# Patient Record
Sex: Female | Born: 1985
Health system: Southern US, Community
[De-identification: ages and names within clinical notes are randomized; demographics above are authoritative.]

## PROBLEM LIST (undated history)

## (undated) DIAGNOSIS — F419 Anxiety disorder, unspecified: Secondary | ICD-10-CM

## (undated) DIAGNOSIS — O009 Unspecified ectopic pregnancy without intrauterine pregnancy: Secondary | ICD-10-CM

## (undated) HISTORY — PX: UPPER GI ENDOSCOPY: SHX6162

## (undated) HISTORY — PX: KNEE SURGERY: SHX244

## (undated) HISTORY — DX: Unspecified ectopic pregnancy without intrauterine pregnancy: O00.90

## (undated) HISTORY — DX: Anxiety disorder, unspecified: F41.9

## (undated) HISTORY — PX: LYMPH NODE BIOPSY: SHX201

---

## 2013-04-07 ENCOUNTER — Ambulatory Visit (INDEPENDENT_AMBULATORY_CARE_PROVIDER_SITE_OTHER): Payer: BC Managed Care – PPO | Admitting: Family Medicine

## 2013-04-07 ENCOUNTER — Encounter: Payer: Self-pay | Admitting: Family Medicine

## 2013-04-07 VITALS — BP 104/60 | HR 99 | Temp 98.3°F | Ht 71.0 in | Wt 163.5 lb

## 2013-04-07 DIAGNOSIS — J020 Streptococcal pharyngitis: Secondary | ICD-10-CM | POA: Insufficient documentation

## 2013-04-07 LAB — POCT RAPID STREP A (OFFICE): Rapid Strep A Screen: NEGATIVE

## 2013-04-07 MED ORDER — AMOXICILLIN 875 MG PO TABS: 875.0000 mg | ORAL_TABLET | Freq: Two times a day (BID) | ORAL | Status: AC

## 2013-04-07 NOTE — Progress Notes (Signed)
H/o tonsillitis in the past.  Started with diffuse aches.  Then had a fever.  Didn't sleep well.  Then has a ST in the meantime. Pain swallowing. No cough.  B ear pain.  No rhinorrhea.  No rash.  Known sick contacts.  She can still swallow.  Voice is lower pitch than typical.  She can take a deep breath. Speaking in complete sentences.   Had seen [facility] student health prev.   Meds, vitals, and allergies reviewed.   ROS: See HPI.  Otherwise, noncontributory.  GEN: nad, alert and oriented HEENT: mucous membranes moist, tm w/o erythema, nasal exam w/o erythema, clear discharge noted,  OP with cobblestoning and B (R slightly > L) tonsillar enlargement with B erythema and exudates.   NECK: supple w/ B tender LA, no stridor CV: rrr.   PULM: ctab, no inc wob EXT: no edema SKIN: no acute rash

## 2013-04-07 NOTE — Assessment & Plan Note (Signed)
Presumed with fever, ST, tender LA, exudates and absence of cough.  Not likely to be mono, no sign of PTA.  Okay for outpatient f/u.  Routine cautions given.  F/u prn.  Start amoxil today.

## 2013-04-07 NOTE — Patient Instructions (Addendum)
Start the antibiotics today.  Drink plenty of fluids, take tylenol as needed, and gargle with warm salt water for your throat.  This should gradually improve.  Take care.  Let us know if you have other concerns.  

## 2014-05-11 ENCOUNTER — Emergency Department: Payer: Self-pay | Admitting: Emergency Medicine

## 2014-05-11 LAB — CBC
HCT: 39.3 % (ref 35.0–47.0)
HGB: 13.1 g/dL (ref 12.0–16.0)
MCH: 29.4 pg (ref 26.0–34.0)
MCHC: 33.4 g/dL (ref 32.0–36.0)
MCV: 88 fL (ref 80–100)
Platelet: 186 10*3/uL (ref 150–440)
RBC: 4.46 10*6/uL (ref 3.80–5.20)
RDW: 13.2 % (ref 11.5–14.5)
WBC: 10.8 10*3/uL (ref 3.6–11.0)

## 2014-05-11 LAB — HCG, QUANTITATIVE, PREGNANCY: Beta Hcg, Quant.: 144 m[IU]/mL — ABNORMAL HIGH

## 2014-05-13 ENCOUNTER — Other Ambulatory Visit: Payer: Self-pay | Admitting: Obstetrics and Gynecology

## 2014-05-13 LAB — HCG, QUANTITATIVE, PREGNANCY: Beta Hcg, Quant.: 112 m[IU]/mL — ABNORMAL HIGH

## 2014-05-15 ENCOUNTER — Ambulatory Visit: Payer: Self-pay | Admitting: Obstetrics and Gynecology

## 2014-05-15 LAB — HCG, QUANTITATIVE, PREGNANCY: Beta Hcg, Quant.: 20 m[IU]/mL — ABNORMAL HIGH

## 2014-05-15 LAB — COMPREHENSIVE METABOLIC PANEL
Albumin: 3.5 g/dL (ref 3.4–5.0)
Alkaline Phosphatase: 38 U/L — ABNORMAL LOW
Anion Gap: 3 — ABNORMAL LOW (ref 7–16)
BUN: 9 mg/dL (ref 7–18)
Bilirubin,Total: 0.4 mg/dL (ref 0.2–1.0)
Calcium, Total: 8.2 mg/dL — ABNORMAL LOW (ref 8.5–10.1)
Chloride: 109 mmol/L — ABNORMAL HIGH (ref 98–107)
Co2: 28 mmol/L (ref 21–32)
Creatinine: 0.73 mg/dL (ref 0.60–1.30)
EGFR (African American): >60
EGFR (Non-African Amer.): >60
Glucose: 98 mg/dL (ref 65–99)
Osmolality: 278 (ref 275–301)
Potassium: 3.7 mmol/L (ref 3.5–5.1)
SGOT(AST): 22 U/L (ref 15–37)
SGPT (ALT): 17 U/L (ref 12–78)
Sodium: 140 mmol/L (ref 136–145)
Total Protein: 6.6 g/dL (ref 6.4–8.2)

## 2014-12-25 NOTE — L&D Delivery Note (Signed)
Delivery Note Patient pushed well for 10 minutes.  At 3:03 AM a viable female was delivered via Vaginal, Spontaneous Delivery (Presentation: ; Occiput Anterior).  APGAR: 9, 9; weight pending .   Placenta status: Intact, Spontaneous.  Cord: 3 vessels with the following complications: None.  Cord pH: n/a  Anesthesia: Epidural  Episiotomy: None Lacerations: 1st degree;Vaginal Suture Repair: 3.0 vicryl rapide Est. Blood Loss (mL): 300  Mom to postpartum.  Baby to Couplet care / Skin to Skin.  [place] 10/10/2015, 3:55 AM

## 2015-02-17 LAB — OB RESULTS CONSOLE GC/CHLAMYDIA
Chlamydia: NEGATIVE
Gonorrhea: NEGATIVE

## 2015-02-23 DIAGNOSIS — H15109 Unspecified episcleritis, unspecified eye: Secondary | ICD-10-CM | POA: Insufficient documentation

## 2015-03-09 LAB — OB RESULTS CONSOLE RUBELLA ANTIBODY, IGM: Rubella: IMMUNE

## 2015-03-09 LAB — OB RESULTS CONSOLE ANTIBODY SCREEN: Antibody Screen: NEGATIVE

## 2015-03-09 LAB — OB RESULTS CONSOLE ABO/RH: RH Type: POSITIVE

## 2015-03-09 LAB — OB RESULTS CONSOLE RPR: RPR: NONREACTIVE

## 2015-03-09 LAB — OB RESULTS CONSOLE HEPATITIS B SURFACE ANTIGEN: Hepatitis B Surface Ag: NEGATIVE

## 2015-03-09 LAB — OB RESULTS CONSOLE HIV ANTIBODY (ROUTINE TESTING): HIV: NONREACTIVE

## 2015-04-17 NOTE — Op Note (Signed)
PATIENT NAME:  Megan Pearson, Megan Pearson MR#:  [id number] DATE OF BIRTH:  1986-03-18  DATE OF PROCEDURE:  05/15/2014  PREOPERATIVE DIAGNOSIS: Left ectopic pregnancy.   POSTOPERATIVE DIAGNOSIS: No evidence of ectopic pregnancy.   OPERATION PERFORMED: Diagnostic laparoscopy.   ANESTHESIA USED: General.   PRIMARY SURGEON: Dr. Malachy Mood.   ESTIMATED BLOOD LOSS: Minimal.   OPERATIVE FLUIDS:  1000 mL of crystalloid.   URINE OUTPUT: 50 mL of clear urine.   PREOPERATIVE ANTIBIOTICS: None.   COMPLICATIONS: None.   FINDINGS: Normal tubes, ovaries, and uterus. On ultrasound, a 1 cm cystic mass was visualized with a positive Doppler flow in the setting of a plateaued beta vaginal bleeding and abdominal pain.  The  right side was inspected as well during the surgery and appeared to have a right ovary and fallopian tube.   SPECIMENS REMOVED: None.   THE PATIENT'S CONDITION FOLLOWING THE PROCEDURE: Stable.   PROCEDURE IN DETAIL: Risks, benefits, and alternatives to procedure were discussed with the patient prior to proceeding to the operating room. Methotrexate was discussed as an alternative treatment option but she chose to proceed with laparoscopic management of what was appeared to be a left tubal ectopic pregnancy. The patient was taken to the operating room where she was placed under general endotracheal anesthesia. She was positioned in the dorsal lithotomy position, prepped and draped in the usual sterile fashion. A timeout procedure was performed. Attention was turned to the patient's pelvis. An operative speculum was placed. The anterior lip of the cervix was grasped with a single-tooth tenaculum and a Hulka tenaculum was placed to allow uterine manipulation.  The single-tooth tenaculum and sterile speculum were then removed. Attention was turned to the patient's abdomen. The umbilicus was infiltrated with 0.5% Sensorcaine. A stab incision was made at the base of the umbilicus and a 5 mm XL  trocar was used to gain entry into the peritoneal cavity under direct visualization. Pneumoperitoneum was established. An 11 mm right assistant port and a 5 mm left assistant port were then placed under direct visualization. Inspection of the left adnexal structure revealed no evidence of ectopic. The cul-de-sac was clear and did not contain any products of conception that might suggest a tubal abortion. There was no hemoperitoneum.  The right adnexa was also visualized to grossly normal. The uterus was normal size, not enlarged. The remainder of the abdomen including appendix, liver appeared normal. The ureters were visualized and traced throughout the course and were noted to be peristalsing. Following the inspection of the abdomen, no evidence of ectopic. Pneumoperitoneum was evacuated. The ports were removed and the port sites were closed with Dermabond. The 11 mm port site was also closed with a 4 Monocryl in subcuticular fashion.  Sponge, needle, and instrument counts were correct x 2. The patient tolerated the procedure well and was taken to the recovery room in stable condition.    ____________________________ Stoney Bang. Georgianne Fick, MD ams:dd D: 05/21/2014 20:43:47 ET T: 05/22/2014 04:40:01 ET JOB#: [id number]  cc: Stoney Bang. Georgianne Fick, MD, <Dictator> Conan Bowens Madelon Lips MD ELECTRONICALLY SIGNED 06/04/2014 8:28

## 2015-10-09 ENCOUNTER — Inpatient Hospital Stay (HOSPITAL_COMMUNITY)
Admission: AD | Admit: 2015-10-09 | Discharge: 2015-10-11 | DRG: 775 | Disposition: A | Discharge status: Home / Self Care | Payer: Managed Care, Other (non HMO) | Source: Ambulatory Visit | Attending: Obstetrics | Admitting: Obstetrics

## 2015-10-09 ENCOUNTER — Encounter (HOSPITAL_COMMUNITY): Payer: Self-pay | Admitting: *Deleted

## 2015-10-09 DIAGNOSIS — F17200 Nicotine dependence, unspecified, uncomplicated: Secondary | ICD-10-CM | POA: Diagnosis present

## 2015-10-09 DIAGNOSIS — Z3A40 40 weeks gestation of pregnancy: Secondary | ICD-10-CM | POA: Diagnosis not present

## 2015-10-09 DIAGNOSIS — O4290 Premature rupture of membranes, unspecified as to length of time between rupture and onset of labor, unspecified weeks of gestation: Secondary | ICD-10-CM

## 2015-10-09 DIAGNOSIS — O480 Post-term pregnancy: Secondary | ICD-10-CM | POA: Diagnosis present

## 2015-10-09 DIAGNOSIS — O99334 Smoking (tobacco) complicating childbirth: Secondary | ICD-10-CM | POA: Diagnosis present

## 2015-10-09 DIAGNOSIS — O99344 Other mental disorders complicating childbirth: Secondary | ICD-10-CM | POA: Diagnosis present

## 2015-10-09 DIAGNOSIS — F419 Anxiety disorder, unspecified: Secondary | ICD-10-CM | POA: Diagnosis present

## 2015-10-09 LAB — CBC
HCT: 32.8 % — ABNORMAL LOW (ref 36.0–46.0)
Hemoglobin: 10.9 g/dL — ABNORMAL LOW (ref 12.0–15.0)
MCH: 29.1 pg (ref 26.0–34.0)
MCHC: 33.2 g/dL (ref 30.0–36.0)
MCV: 87.7 fL (ref 78.0–100.0)
Platelets: 166 10*3/uL (ref 150–400)
RBC: 3.74 MIL/uL — ABNORMAL LOW (ref 3.87–5.11)
RDW: 13.9 % (ref 11.5–15.5)
WBC: 14.9 10*3/uL — ABNORMAL HIGH (ref 4.0–10.5)

## 2015-10-09 LAB — TYPE AND SCREEN
ABO/RH(D): A POS
Antibody Screen: NEGATIVE

## 2015-10-09 LAB — POCT FERN TEST: POCT Fern Test: POSITIVE

## 2015-10-09 LAB — OB RESULTS CONSOLE GBS: GBS: NEGATIVE

## 2015-10-09 MED ORDER — TERBUTALINE SULFATE 1 MG/ML IJ SOLN
INTRAMUSCULAR | Status: AC
  Administered 2015-10-09: 0.25 mg
  Filled 2015-10-09: qty 1

## 2015-10-09 MED ORDER — OXYCODONE-ACETAMINOPHEN 5-325 MG PO TABS
1.0000 | ORAL_TABLET | ORAL | Status: DC | PRN
  Administered 2015-10-10: 1 via ORAL
  Filled 2015-10-09: qty 1

## 2015-10-09 MED ORDER — OXYTOCIN 40 UNITS IN LACTATED RINGERS INFUSION - SIMPLE MED
62.5000 mL/h | INTRAVENOUS | Status: DC
  Administered 2015-10-10: 62.5 mL/h via INTRAVENOUS
  Filled 2015-10-09: qty 1000

## 2015-10-09 MED ORDER — LACTATED RINGERS IV SOLN
INTRAVENOUS | Status: DC
  Administered 2015-10-10: 02:00:00 via INTRAVENOUS

## 2015-10-09 MED ORDER — ONDANSETRON HCL 4 MG/2ML IJ SOLN: 4.0000 mg | Freq: Four times a day (QID) | INTRAMUSCULAR | Status: DC | PRN

## 2015-10-09 MED ORDER — FLEET ENEMA 7-19 GM/118ML RE ENEM: 1.0000 | ENEMA | RECTAL | Status: DC | PRN

## 2015-10-09 MED ORDER — OXYTOCIN BOLUS FROM INFUSION
500.0000 mL | INTRAVENOUS | Status: DC
  Administered 2015-10-10: 500 mL via INTRAVENOUS

## 2015-10-09 MED ORDER — OXYCODONE-ACETAMINOPHEN 5-325 MG PO TABS: 2.0000 | ORAL_TABLET | ORAL | Status: DC | PRN

## 2015-10-09 MED ORDER — LACTATED RINGERS IV SOLN
500.0000 mL | INTRAVENOUS | Status: DC | PRN
  Administered 2015-10-09 – 2015-10-10 (×2): 500 mL via INTRAVENOUS

## 2015-10-09 MED ORDER — ACETAMINOPHEN 325 MG PO TABS: 650.0000 mg | ORAL_TABLET | ORAL | Status: DC | PRN

## 2015-10-09 MED ORDER — CITRIC ACID-SODIUM CITRATE 334-500 MG/5ML PO SOLN: 30.0000 mL | ORAL | Status: DC | PRN

## 2015-10-09 MED ORDER — MISOPROSTOL 200 MCG PO TABS
200.0000 ug | ORAL_TABLET | ORAL | Status: DC
  Administered 2015-10-09: 200 ug via ORAL
  Filled 2015-10-09: qty 1

## 2015-10-09 MED ORDER — BUTORPHANOL TARTRATE 1 MG/ML IJ SOLN
1.0000 mg | INTRAMUSCULAR | Status: DC | PRN
  Administered 2015-10-10: 1 mg via INTRAVENOUS
  Filled 2015-10-09: qty 1

## 2015-10-09 MED ORDER — LIDOCAINE HCL (PF) 1 % IJ SOLN
30.0000 mL | INTRAMUSCULAR | Status: DC | PRN
  Administered 2015-10-10: 30 mL via SUBCUTANEOUS
  Filled 2015-10-09: qty 30

## 2015-10-09 NOTE — H&P (Signed)
29 y.o. [id number] @ [redacted]w[redacted]d presents with c/o leakage of fluid.  On exam, she is grossly ruptured with positive fern.  Otherwise has good fetal movement and no bleeding.  Past Medical History  Diagnosis Date  . Anxiety     Past Surgical History  Procedure Laterality Date  . Knee surgery Left     arthroscopy  . Lymph node biopsy      benign, Left side of neck   . Upper gi endoscopy      OB History  Gravida Para Term Preterm AB SAB TAB Ectopic Multiple Living  4 1 1  2  1 1  2     # Outcome Date GA Lbr Len/2nd Weight Sex Delivery Anes PTL Lv  4 Current           3 Term 09/24/08 [redacted]w[redacted]d  3.515 kg (7 lb 12 oz) F Vag-Spont   Y  2 Ectopic           1 TAB               Social History   Social History  . Marital Status: Married    Spouse Name: N/A  . Number of Children: N/A  . Years of Education: N/A   Occupational History  . Not on file.   Social History Main Topics  . Smoking status: Current Every Day Smoker -- 0.50 packs/day for 10 years    Types: Cigarettes  . Smokeless tobacco: Not on file  . Alcohol Use: Not on file  . Drug Use: Not on file  . Sexual Activity: Yes    Birth Control/ Protection: None   Other Topics Concern  . 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    Prenatal Transfer Tool  Maternal Diabetes: No Genetic Screening: Normal Maternal Ultrasounds/Referrals: Normal Fetal Ultrasounds or other Referrals:  None Maternal Substance Abuse:  Yes:  Type: Smoker Significant Maternal Medications:  None Significant Maternal Lab Results: Lab values include: Group B Strep negative  ABO, Rh: A/Positive/-- (03/15 0000) Antibody: Negative (03/15 0000) Rubella:  Immune RPR: Nonreactive (03/15 0000)  HBsAg: Negative (03/15 0000)  HIV: Non-reactive (03/15 0000)  GBS: Negative (10/15 0000)    Other PNC: uncomplicated.    Filed  Vitals:   10/09/15 2034  BP:   Pulse:   Temp: 98.4 F (36.9 C)  Resp: 20     General:  NAD Abdomen:  soft, gravid, EFW 7-7.5# Ex:  1+ edema SVE:  1/60/-2 per RN FHTs:  140s, mod var, + accels, no decels Toco:  Rare ctx   A/P   29 y.o. G2P1001 [redacted]w[redacted]d presents with PROM Admit to L&D IOL--buccal cytotec, pitocin prn Epidural upon maternal request FSR/ vtx/ GBS neg  [place]

## 2015-10-09 NOTE — MAU Note (Signed)
Pt presents to MAU with complaints of rupture of membranes around 4 today

## 2015-10-09 NOTE — MAU Provider Note (Signed)
S: Patient reports leaking clear fluid since 4 PM today. Irregular, mild contractions.  O: BP 123/72 mmHg  Pulse 74  Temp(Src) 98 F (36.7 C) (Oral)  Resp 18  Ht 5\' 9"  (1.753 m)  Wt 217 lb 12.8 oz (98.793 kg)  BMI 32.15 kg/m2  SpO2 99%  LMP 01/02/2015 Pelvic exam: Grossly ruptured. Fern positive. Abdomen: Soft, nontender. Gravid, size equals dates. Fetal heart rate reactive. Dilation: 1 Effacement (%): 60 Station: -3 Exam by:: Devoria Glassing RN  A: PROM  P: Admit to labor and delivery Dr. Carlis Abbott to assume care of patient.  [place], CNM 10/09/2015 7:13 PM

## 2015-10-09 NOTE — Progress Notes (Signed)
terbutaline 0.25mg  given

## 2015-10-09 NOTE — Progress Notes (Signed)
At 2120 administered 273mcg of Cytotec buccal, per order in [facility]. Prior to administration baby had 15x15 accelerations, moderate variability with no decelerations. Patient stated feeling mild contractions intermittently. At 2138 baby had 2 min deceleration into the 90s, after position change baby returned to baseline heart rate of 150s. At 2150 baby had a 1 min deceleration that promptly returned to baseline with position change. Dr. Carlis Abbott was at bedside and actively reviewing FHR. At 2225 received phone call from Dr. Carlis Abbott regarding the incorrect Cytotec dose that had been administered. Baby heart rate remained reassuring, with moderate variability, accelerations, with few variables. Administered Terbutaline and gave 545ml fluid bolus. FHR remains reassuring.

## 2015-10-09 NOTE — MAU Note (Signed)
Urine Sent to lab @1800 .

## 2015-10-09 NOTE — Progress Notes (Signed)
Patient noted to have a deep variable to the 60s x 3 minutes.  Prior to my arrival at the room, RN SVE was 3 cm.  Following spontaneous return to baseline, an additional variable to the 90s x 2 minutes occurred.  With repositioning, return to baseline of the 150s occurred.   Moderate variability and accels were present following the decelerations.    Following the decelerations, it was noted that an incorrect dose of cytotec was administered (242mcg) at 2120.  At this time, the patient was noted to be contracting q2 minutes with increasing discomfort.  The decision was made to given one dose of terbutaline 0.25mg  prophylatically at 2234.  Contractions spaced out and fetal status remained reassuring.  The patient was alerted to the error in dosage.    Will continue to monitor until 0120, 4 hours after cytotec administration.  If cervix is unchanged, will start pitocin.

## 2015-10-10 ENCOUNTER — Inpatient Hospital Stay (HOSPITAL_COMMUNITY): Payer: Managed Care, Other (non HMO) | Admitting: Anesthesiology

## 2015-10-10 ENCOUNTER — Encounter (HOSPITAL_COMMUNITY): Payer: Self-pay

## 2015-10-10 LAB — CBC
HCT: 29.3 % — ABNORMAL LOW (ref 36.0–46.0)
Hemoglobin: 9.8 g/dL — ABNORMAL LOW (ref 12.0–15.0)
MCH: 29.2 pg (ref 26.0–34.0)
MCHC: 33.4 g/dL (ref 30.0–36.0)
MCV: 87.2 fL (ref 78.0–100.0)
Platelets: 152 10*3/uL (ref 150–400)
RBC: 3.36 MIL/uL — ABNORMAL LOW (ref 3.87–5.11)
RDW: 13.8 % (ref 11.5–15.5)
WBC: 19.3 10*3/uL — ABNORMAL HIGH (ref 4.0–10.5)

## 2015-10-10 LAB — ABO/RH: ABO/RH(D): A POS

## 2015-10-10 LAB — RPR: RPR Ser Ql: NONREACTIVE

## 2015-10-10 MED ORDER — SIMETHICONE 80 MG PO CHEW: 80.0000 mg | CHEWABLE_TABLET | ORAL | Status: DC | PRN

## 2015-10-10 MED ORDER — BENZOCAINE-MENTHOL 20-0.5 % EX AERO: 1.0000 "application " | INHALATION_SPRAY | CUTANEOUS | Status: DC | PRN

## 2015-10-10 MED ORDER — SENNOSIDES-DOCUSATE SODIUM 8.6-50 MG PO TABS
2.0000 | ORAL_TABLET | ORAL | Status: DC
  Administered 2015-10-10: 2 via ORAL
  Filled 2015-10-10: qty 2

## 2015-10-10 MED ORDER — OXYCODONE-ACETAMINOPHEN 5-325 MG PO TABS: 1.0000 | ORAL_TABLET | ORAL | Status: DC | PRN

## 2015-10-10 MED ORDER — TETANUS-DIPHTH-ACELL PERTUSSIS 5-2.5-18.5 LF-MCG/0.5 IM SUSP: 0.5000 mL | Freq: Once | INTRAMUSCULAR | Status: DC

## 2015-10-10 MED ORDER — WITCH HAZEL-GLYCERIN EX PADS: 1.0000 "application " | MEDICATED_PAD | CUTANEOUS | Status: DC | PRN

## 2015-10-10 MED ORDER — ONDANSETRON HCL 4 MG PO TABS: 4.0000 mg | ORAL_TABLET | ORAL | Status: DC | PRN

## 2015-10-10 MED ORDER — DIPHENHYDRAMINE HCL 25 MG PO CAPS: 25.0000 mg | ORAL_CAPSULE | Freq: Four times a day (QID) | ORAL | Status: DC | PRN

## 2015-10-10 MED ORDER — DIPHENHYDRAMINE HCL 50 MG/ML IJ SOLN: 12.5000 mg | INTRAMUSCULAR | Status: DC | PRN

## 2015-10-10 MED ORDER — DIBUCAINE 1 % RE OINT: 1.0000 "application " | TOPICAL_OINTMENT | RECTAL | Status: DC | PRN

## 2015-10-10 MED ORDER — IBUPROFEN 600 MG PO TABS
600.0000 mg | ORAL_TABLET | Freq: Four times a day (QID) | ORAL | Status: DC
  Administered 2015-10-10 – 2015-10-11 (×5): 600 mg via ORAL
  Filled 2015-10-10 (×5): qty 1

## 2015-10-10 MED ORDER — LIDOCAINE HCL (PF) 1 % IJ SOLN
INTRAMUSCULAR | Status: DC | PRN
Start: 2015-10-10 — End: 2015-10-10
  Administered 2015-10-10 (×2): 3 mL via EPIDURAL

## 2015-10-10 MED ORDER — ACETAMINOPHEN 325 MG PO TABS: 650.0000 mg | ORAL_TABLET | ORAL | Status: DC | PRN

## 2015-10-10 MED ORDER — ONDANSETRON HCL 4 MG/2ML IJ SOLN: 4.0000 mg | INTRAMUSCULAR | Status: DC | PRN

## 2015-10-10 MED ORDER — EPHEDRINE 5 MG/ML INJ: 10.0000 mg | INTRAVENOUS | Status: DC | PRN

## 2015-10-10 MED ORDER — FENTANYL 2.5 MCG/ML BUPIVACAINE 1/10 % EPIDURAL INFUSION (WH - ANES)
14.0000 mL/h | INTRAMUSCULAR | Status: DC | PRN
  Administered 2015-10-10: 14 mL/h via EPIDURAL
  Filled 2015-10-10: qty 125

## 2015-10-10 MED ORDER — PHENYLEPHRINE 40 MCG/ML (10ML) SYRINGE FOR IV PUSH (FOR BLOOD PRESSURE SUPPORT)
80.0000 ug | PREFILLED_SYRINGE | INTRAVENOUS | Status: DC | PRN
  Filled 2015-10-10: qty 20

## 2015-10-10 MED ORDER — PRENATAL MULTIVITAMIN CH
1.0000 | ORAL_TABLET | Freq: Every day | ORAL | Status: DC
  Administered 2015-10-10 – 2015-10-11 (×2): 1 via ORAL
  Filled 2015-10-10 (×2): qty 1

## 2015-10-10 MED ORDER — OXYCODONE-ACETAMINOPHEN 5-325 MG PO TABS
2.0000 | ORAL_TABLET | ORAL | Status: DC | PRN
  Administered 2015-10-10 – 2015-10-11 (×7): 2 via ORAL
  Filled 2015-10-10 (×7): qty 2

## 2015-10-10 MED ORDER — LANOLIN HYDROUS EX OINT: TOPICAL_OINTMENT | CUTANEOUS | Status: DC | PRN

## 2015-10-10 NOTE — Anesthesia Procedure Notes (Signed)
Epidural Patient location during procedure: OB Start time: 10/10/2015 1:40 AM End time: 10/10/2015 1:48 AM  Staffing Anesthesiologist: Suella Broad D Performed by: anesthesiologist   Preanesthetic Checklist Completed: patient identified, site marked, surgical consent, pre-op evaluation, timeout performed, IV checked, risks and benefits discussed and monitors and equipment checked  Epidural Patient position: sitting Prep: DuraPrep Patient monitoring: heart rate, continuous pulse ox and blood pressure Approach: midline Location: L4-L5 Injection technique: LOR saline  Needle:  Needle type: Tuohy  Needle gauge: 18 G Needle length: 9 cm and 9 Catheter type: closed end flexible Catheter size: 20 Guage Test dose: negative and Other  Assessment Events: blood not aspirated, injection not painful, no injection resistance, negative IV test and no paresthesia  Additional Notes LOR @ 6.5  Patient identified. Risks/Benefits/Options discussed with patient including but not limited to bleeding, infection, nerve damage, paralysis, failed block, incomplete pain control, headache, blood pressure changes, nausea, vomiting, reactions to medications, itching and postpartum back pain. Confirmed with bedside nurse the patient's most recent platelet count. Confirmed with patient that they are not currently taking any anticoagulation, have any bleeding history or any family history of bleeding disorders. Patient expressed understanding and wished to proceed. All questions were answered. Sterile technique was used throughout the entire procedure. Please see nursing notes for vital signs. Test dose was given through epidural catheter and negative prior to continuing to dose epidural or start infusion. Warning signs of high block given to the patient including shortness of breath, tingling/numbness in hands, complete motor block, or any concerning symptoms with instructions to call for help. Patient was given  instructions on fall risk and not to get out of bed. All questions and concerns addressed with instructions to call with any issues or inadequate analgesia.      Patient tolerated the insertion well without complications.Reason for block:procedure for pain

## 2015-10-10 NOTE — Lactation Note (Signed)
This note was copied from the chart of [facility]. Lactation Consultation Note  Patient Name: Megan Pearson [id number] Date: 10/10/2015 Reason for consult: Initial assessment Mom is experienced BF and reports baby is nursing well so far. Denies any questions/concerns. Lactation brochure left for review, advised of OP services and support group. Encouraged to call for assist if needed.   Maternal Data Has patient been taught Hand Expression?: No (Mom is experienced BF and reports she knows how to hand express) Does the patient have breastfeeding experience prior to this delivery?: Yes  Feeding Feeding Type: Breast Fed Length of feed: 40 min (per Mom)  LATCH Score/Interventions Latch: Repeated attempts needed to sustain latch, nipple held in mouth throughout feeding, stimulation needed to elicit sucking reflex.  Audible Swallowing: A few with stimulation  Type of Nipple: Everted at rest and after stimulation  Comfort (Breast/Nipple): Soft / non-tender     Hold (Positioning): No assistance needed to correctly position infant at breast.  LATCH Score: 8  Lactation Tools Discussed/Used WIC Program: Yes   Consult Status Consult Status: Follow-up Date: 10/11/15 Follow-up type: In-patient    Katrine Coho 10/10/2015, 10:09 PM

## 2015-10-10 NOTE — Anesthesia Preprocedure Evaluation (Addendum)
Anesthesia Evaluation  Patient identified by MRN, date of birth, ID band Patient awake    Airway Mallampati: II  TM Distance: >3 FB Neck ROM: Full    Dental  (+) Teeth Intact   Pulmonary Current Smoker,    breath sounds clear to auscultation       Cardiovascular negative cardio ROS   Rhythm:Regular Rate:Normal     Neuro/Psych Anxiety negative neurological ROS     GI/Hepatic negative GI ROS, Neg liver ROS,   Endo/Other  negative endocrine ROS  Renal/GU negative Renal ROS  negative genitourinary   Musculoskeletal negative musculoskeletal ROS (+)   Abdominal   Peds negative pediatric ROS (+)  Hematology negative hematology ROS (+)   Anesthesia Other Findings   Reproductive/Obstetrics negative OB ROS                            Lab Results  Component Value Date   WBC 14.9* 10/09/2015   HGB 10.9* 10/09/2015   HCT 32.8* 10/09/2015   MCV 87.7 10/09/2015   PLT 166 10/09/2015   No results found for: INR, PROTIME   Anesthesia Physical Anesthesia Plan  ASA: II  Anesthesia Plan: Epidural   Post-op Pain Management:    Induction:   Airway Management Planned:   Additional Equipment:   Intra-op Plan:   Post-operative Plan:   Informed Consent: I have reviewed the patients History and Physical, chart, labs and discussed the procedure including the risks, benefits and alternatives for the proposed anesthesia with the patient or authorized representative who has indicated his/her understanding and acceptance.     Plan Discussed with:   Anesthesia Plan Comments:         Anesthesia Quick Evaluation

## 2015-10-10 NOTE — Anesthesia Postprocedure Evaluation (Addendum)
Anesthesia Post Note  Patient: Megan Pearson  Procedure(s) Performed: * No procedures listed *  Anesthesia type: Epidural  Patient location: Mother/Baby  Post pain: Pain level controlled  Post assessment: Post-op Vital signs reviewed  Last Vitals:  Filed Vitals:   10/10/15 0653  BP: 120/52  Pulse: 60  Temp: 36.9 C  Resp: 18    Post vital signs: Reviewed  Level of consciousness:alert  Complications: No apparent anesthesia complications  Patient ended up with what sounds like a one sided epidural. Appropriate hospital protocol was used with maneuvers to treat this with lateral positioning, extra PCEA bolus's being given. She went into active labor within an hour of her epidural being placed and there was not enough time for it to be replaced. I have addressed all of their concerns and they have all their questions answered. I will communicate with the labor and delivery deck with this family's post operative experience.   Veatrice Kells, MD

## 2015-10-10 NOTE — Progress Notes (Signed)
Patient is doing well.  She is ambulating, voiding, tolerating PO.  Pain control is good.  Lochia is appropriate  Filed Vitals:   10/10/15 0431 10/10/15 0536 10/10/15 0653 10/10/15 1000  BP: 116/61 112/59 120/52 121/76  Pulse: 65 52 60 69  Temp: 97.8 F (36.6 C) 98 F (36.7 C) 98.5 F (36.9 C) 98.3 F (36.8 C)  TempSrc: Oral Oral Oral Oral  Resp: 18 18 18 20   Height:      Weight:      SpO2:        NAD Fundus firm Ext: 1+ edema b/l  Lab Results  Component Value Date   WBC 19.3* 10/10/2015   HGB 9.8* 10/10/2015   HCT 29.3* 10/10/2015   MCV 87.2 10/10/2015   PLT 152 10/10/2015    --/--/A POS, A POS (10/15 1930)/RImmune  A/P 29 y.o. [id number] PPD#0 s/p TSVD. Routine care.   Expect d/c tomorrow.    [place]

## 2015-10-10 NOTE — Progress Notes (Signed)
SOCIAL WORK MATERNAL/CHILD NOTE  Patient Details  Name: Megan Pearson MRN: [id number] Date of Birth: 10/10/2016  Date: 10/10/2015  Clinical Social Worker Initiating Note: Aashka Salomone, LCSWDate/ Time Initiated: 10/10/15/1130   Child's Name: Megan Pearson   Legal Guardian:  (Parents)   Need for Interpreter: None   Date of Referral: 10/09/15   Reason for Referral: Other (Comment)   Referral Source: [facility]   Address: [address] [place], [place] [address]  Phone number:  [phone])   Household Members: Spouse, Minor Children   Natural Supports (not living in the home): Extended Family, Immediate Family   Professional Supports:None   Employment: (Both parents employed)   Type of Work:     Education:     Printmaker, Medicaid   Other Resources:     Cultural/Religious Considerations Which May Impact Care: none noted Strengths: Ability to meet basic needs , Home prepared for child , Compliance with medical plan    Risk Factors/Current Problems: None   Cognitive State: Alert , Able to Concentrate    Mood/Affect: Happy    CSW Assessment: Acknowledged order for Social Work consult to assess mother's history of anxiety. MOB was pleasant and receptive to CSW. She is married and have one other dependent age 29. Spouse was also present and she requested he remain in the room. Mother acknowledged hx of anxiety and states that she was treated with medication (Xanax) about 3-4 years ago. She notes that the anxiety was directly related to a childhood issue and notes that therapy and medication was effective. She reports no current symptoms or recurring symptoms since she stopped the medication. She also denies any hx of PP Depression. Mother denies any hx of substance abuse. No acute social concerns related at this time. Mother informed of social work Fish farm manager.   CSW  Plan/Description:    Spoke with her and spouse about the signs/symptoms of PP Depression, and available resources No current barriers to discharge  Oria Klimas J, LCSW 10/10/2015, 3:40 PM

## 2015-10-11 MED ORDER — IBUPROFEN 600 MG PO TABS: 600.0000 mg | ORAL_TABLET | Freq: Four times a day (QID) | ORAL | Status: DC | PRN

## 2015-10-11 MED ORDER — OXYCODONE-ACETAMINOPHEN 5-325 MG PO TABS: 1.0000 | ORAL_TABLET | ORAL | Status: DC | PRN

## 2015-10-11 MED ORDER — DOCUSATE SODIUM 100 MG PO CAPS: 100.0000 mg | ORAL_CAPSULE | Freq: Two times a day (BID) | ORAL | Status: DC

## 2015-10-11 MED ORDER — HYDROCORTISONE 2.5 % RE CREA: 1.0000 "application " | TOPICAL_CREAM | Freq: Two times a day (BID) | RECTAL | Status: DC

## 2015-10-11 NOTE — Discharge Summary (Signed)
Obstetric Discharge Summary Reason for Admission: onset of labor Prenatal Procedures: ultrasound Intrapartum Procedures: spontaneous vaginal delivery Postpartum Procedures: none Complications-Operative and Postpartum: 1st degree perineal laceration HEMOGLOBIN  Date Value Ref Range Status  10/10/2015 9.8* 12.0 - 15.0 g/dL Final   HGB  Date Value Ref Range Status  05/11/2014 13.1 12.0-16.0 g/dL Final   HCT  Date Value Ref Range Status  10/10/2015 29.3* 36.0 - 46.0 % Final  05/11/2014 39.3 35.0-47.0 % Final    Physical Exam:  General: alert, cooperative and appears stated age 29: appropriate Uterine Fundus: firm  Discharge Diagnoses: Term Pregnancy-delivered  Discharge Information: Date: 10/11/2015 Activity: pelvic rest Diet: routine Medications: Ibuprofen, Colace and Percocet Condition: improved Instructions: refer to practice specific booklet Discharge to: home Follow-up Information    Follow up with [facility] Carlis Abbott, MD In 4 weeks.   Specialty:  Obstetrics   Why:  for a postpartum evaluation   Contact information:   [address] [place] [address] [phone]       Newborn Data: Live born female  Birth Weight: 8 lb 14.6 oz (4043 g) APGAR: 9, 9  Home with mother.  Megan Kincaid H. 10/11/2015, 4:43 PM

## 2016-05-15 ENCOUNTER — Encounter: Payer: Self-pay | Admitting: Family Medicine

## 2016-05-15 ENCOUNTER — Ambulatory Visit (INDEPENDENT_AMBULATORY_CARE_PROVIDER_SITE_OTHER): Payer: Managed Care, Other (non HMO) | Admitting: Family Medicine

## 2016-05-15 VITALS — BP 98/72 | HR 69 | Temp 97.5°F | Ht 71.0 in | Wt 185.5 lb

## 2016-05-15 DIAGNOSIS — Z720 Tobacco use: Secondary | ICD-10-CM

## 2016-05-15 DIAGNOSIS — J0100 Acute maxillary sinusitis, unspecified: Secondary | ICD-10-CM

## 2016-05-15 DIAGNOSIS — K219 Gastro-esophageal reflux disease without esophagitis: Secondary | ICD-10-CM | POA: Diagnosis not present

## 2016-05-15 DIAGNOSIS — F17200 Nicotine dependence, unspecified, uncomplicated: Secondary | ICD-10-CM

## 2016-05-15 DIAGNOSIS — F411 Generalized anxiety disorder: Secondary | ICD-10-CM

## 2016-05-15 MED ORDER — BUPROPION HCL ER (SR) 100 MG PO TB12: 100.0000 mg | ORAL_TABLET | Freq: Every day | ORAL | Status: DC

## 2016-05-15 MED ORDER — ACETAMINOPHEN 325 MG PO TABS: 650.0000 mg | ORAL_TABLET | Freq: Three times a day (TID) | ORAL | Status: DC | PRN

## 2016-05-15 MED ORDER — RANITIDINE HCL 75 MG PO TABS: 75.0000 mg | ORAL_TABLET | Freq: Two times a day (BID) | ORAL | Status: DC | PRN

## 2016-05-15 NOTE — Progress Notes (Signed)
Pre visit review using our clinic review tool, if applicable. No additional management support is needed unless otherwise documented below in the visit note.  Smoking.  1/2 ppd cigs.  She has quit prev.  She had used wellbutrin prev- had been off of it for years.  She is getting ready to wean her son from breast feeding.    H/o anxiety, prev on xanax and wellbutrin.  Off for years.    Sinus sx.  Called telemed recently and started on augmentin in the meantime.  Some better.  Less congested.  No fevers.  No vomiting, no diarrhea.  Less facial pain in the meantime.    Stomach cramping, LUQ, intermittent, "like my stomach is growling, likely hunger pains."  Ibuprofen daily for HA prev, has stopped in the last few days.  She is putting up with HA in the meantime.  No aura with the HA.  She attributed them to tension HA.  No black stools, no blood in stool.  Some better in the last few days off ibuprofen.   PMH and SH reviewed  ROS: Per HPI unless specifically indicated in ROS section   Meds, vitals, and allergies reviewed.   GEN: nad, alert and oriented HEENT: mucous membranes moist, tm w/o erythema, nasal exam w/o erythema, clear discharge noted,  OP with mild cobblestoning, sinuses ttp x4 NECK: supple w/o LA CV: rrr.   PULM: ctab, no inc wob EXT: no edema SKIN: no acute rash

## 2016-05-15 NOTE — Patient Instructions (Signed)
Restart wellburtin once a day.   Let me know when you son is weaned so we can set up the xanax prescription if needed.  Try taking zantac 75-150mg  up to twice a day as needed for the abdominal pain.  Try taking 2 tylenol up to 3 times a day instead of ibuprofen.  Take care.  Glad to see you.

## 2016-05-16 DIAGNOSIS — K219 Gastro-esophageal reflux disease without esophagitis: Secondary | ICD-10-CM | POA: Insufficient documentation

## 2016-05-16 DIAGNOSIS — F17200 Nicotine dependence, unspecified, uncomplicated: Secondary | ICD-10-CM | POA: Insufficient documentation

## 2016-05-16 DIAGNOSIS — J0100 Acute maxillary sinusitis, unspecified: Secondary | ICD-10-CM | POA: Insufficient documentation

## 2016-05-16 DIAGNOSIS — F419 Anxiety disorder, unspecified: Secondary | ICD-10-CM | POA: Insufficient documentation

## 2016-05-16 NOTE — Assessment & Plan Note (Signed)
Nontoxic, continue augmentin.  D/w pt.

## 2016-05-16 NOTE — Assessment & Plan Note (Addendum)
Restart wellbutrin.  Should be okay to use, had tolerated prev, no h/o SZ.  Weaning from breast feeding and benefit likely >> risk.  Start 100mg  a day and update me.  She agrees.  Okay for outpatient fu. I can rx xanax if needed when she has fully weaned her son. >30 minutes spent in face to face time with patient, >50% spent in counselling or coordination of care

## 2016-05-16 NOTE — Assessment & Plan Note (Signed)
Presumed, from nsaid use, change to tylenol and start zantac.  D/w pt.  abd not ttp at [place].

## 2016-05-16 NOTE — Assessment & Plan Note (Signed)
Restart wellbutrin, has helped prev, d/w pt.

## 2016-06-23 ENCOUNTER — Encounter: Payer: Self-pay | Admitting: Family Medicine

## 2016-06-23 ENCOUNTER — Ambulatory Visit (INDEPENDENT_AMBULATORY_CARE_PROVIDER_SITE_OTHER): Payer: Managed Care, Other (non HMO) | Admitting: Family Medicine

## 2016-06-23 VITALS — BP 130/76 | HR 97 | Temp 97.4°F | Ht 71.0 in | Wt 192.8 lb

## 2016-06-23 DIAGNOSIS — M549 Dorsalgia, unspecified: Secondary | ICD-10-CM

## 2016-06-23 MED ORDER — CYCLOBENZAPRINE HCL 10 MG PO TABS: 5.0000 mg | ORAL_TABLET | Freq: Three times a day (TID) | ORAL | Status: DC | PRN

## 2016-06-23 NOTE — Patient Instructions (Signed)
Try heat in the meantime and use flexeril as needed.  Sedation caution.   Gently stretch, continue tylenol.  You should gradually get better.

## 2016-06-23 NOTE — Progress Notes (Signed)
Pre visit review using our clinic review tool, if applicable. No additional management support is needed unless otherwise documented below in the visit note.  Back pain.  Slipped down front steps when the steps were slick with rain.  This was 1 week ago.  Pain at the time. Fell on her back, laid out across the steps.  Didn't hit her head, no LOC.  Pain worse with bending over in the meantime.  Pain across the lower back and R buttock.  Didn't bruise at the time.  No abd pain.  Minimal pain sitting down.  She has a constant dull ache at baseline that can get worse with movement.    Meds, vitals, and allergies reviewed.   ROS: Per HPI unless specifically indicated in ROS section   nad rrr ctab Back w/o midline pain B lower back pain with L>R SI pain on testing.  SLR neg B S/S grossly wnl BLE No bruising.  No cva pain

## 2016-06-25 DIAGNOSIS — M549 Dorsalgia, unspecified: Secondary | ICD-10-CM | POA: Insufficient documentation

## 2016-06-25 NOTE — Assessment & Plan Note (Signed)
D/w pt.  Reassured.  No need to image at this point.  Can try heat in the meantime and use flexeril as needed.  Sedation caution.   Gently stretch, continue tylenol.  She should gradually get better.   F/u prn.  She agrees.

## 2016-12-21 ENCOUNTER — Telehealth: Payer: Self-pay | Admitting: Family Medicine

## 2016-12-21 NOTE — Telephone Encounter (Signed)
Patient is going to be seen in 20 min at [facility].

## 2016-12-21 NOTE — Telephone Encounter (Signed)
Okay to add on here at 4:30 if needed.  Thanks.

## 2016-12-21 NOTE — Telephone Encounter (Signed)
[facility]  Patient Name: Megan Pearson  DOB: 06/25/1986    Initial Comment Caller states she has a spider bite on her neck. Her neck is swollen around her lymph nodes and neck is stiff.   Nurse Assessment  Nurse: Wayne Sever, RN, Tillie Rung Date/Time (Eastern Time): 12/21/2016 10:52:19 AM  Confirm and document reason for call. If symptomatic, describe symptoms. ---Caller states she has a rash on her neck. She states it's painful and tender to the touch. She states she woke up this morning with neck pain. She states she think it's a swollen lymph node. Denies any fever.  Does the patient have any new or worsening symptoms? ---Yes  Will a triage be completed? ---Yes  Related visit to physician within the last 2 weeks? ---No  Does the PT have any chronic conditions? (i.e. diabetes, asthma, etc.) ---No  Is the patient pregnant or possibly pregnant? (Ask all females between the ages of 45-55) ---No  Is this a behavioral health or substance abuse call? ---No     Guidelines    Guideline Title Affirmed Question Affirmed Notes  Lymph Nodes Swollen [1] Single large node AND [2] size > 1 inch (2.5 cm) AND [3] no fever    Final Disposition User   See Physician within 24 Hours [place], RN, Tillie Rung    Comments  No appointments in the office, Advised patient I could check the other offices, but she states [facility] is by her house and she will go there instead.   Referrals  GO TO FACILITY OTHER - SPECIFY   Disagree/Comply: Comply

## 2017-05-11 DIAGNOSIS — H15112 Episcleritis periodica fugax, left eye: Secondary | ICD-10-CM | POA: Diagnosis not present

## 2017-05-25 ENCOUNTER — Telehealth: Payer: Managed Care, Other (non HMO) | Admitting: Nurse Practitioner

## 2017-05-25 DIAGNOSIS — J0111 Acute recurrent frontal sinusitis: Secondary | ICD-10-CM

## 2017-05-25 MED ORDER — AMOXICILLIN-POT CLAVULANATE 875-125 MG PO TABS: 1.0000 | ORAL_TABLET | Freq: Two times a day (BID) | ORAL | 0 refills | Status: DC

## 2017-05-25 NOTE — Progress Notes (Signed)

## 2017-05-30 ENCOUNTER — Encounter: Payer: Self-pay | Admitting: Family Medicine

## 2017-05-30 ENCOUNTER — Ambulatory Visit (INDEPENDENT_AMBULATORY_CARE_PROVIDER_SITE_OTHER): Payer: BLUE CROSS/BLUE SHIELD | Admitting: Family Medicine

## 2017-05-30 VITALS — BP 128/78 | HR 101 | Temp 98.6°F | Ht 71.0 in | Wt 191.4 lb

## 2017-05-30 DIAGNOSIS — F419 Anxiety disorder, unspecified: Secondary | ICD-10-CM | POA: Diagnosis not present

## 2017-05-30 DIAGNOSIS — J0100 Acute maxillary sinusitis, unspecified: Secondary | ICD-10-CM | POA: Diagnosis not present

## 2017-05-30 DIAGNOSIS — R21 Rash and other nonspecific skin eruption: Secondary | ICD-10-CM | POA: Diagnosis not present

## 2017-05-30 MED ORDER — BENZONATATE 200 MG PO CAPS: 200.0000 mg | ORAL_CAPSULE | Freq: Three times a day (TID) | ORAL | 0 refills | Status: DC | PRN

## 2017-05-30 MED ORDER — ESCITALOPRAM OXALATE 10 MG PO TABS: 10.0000 mg | ORAL_TABLET | Freq: Every day | ORAL | 1 refills | Status: DC

## 2017-05-30 MED ORDER — ALPRAZOLAM 0.25 MG PO TABS: 0.2500 mg | ORAL_TABLET | Freq: Two times a day (BID) | ORAL | 0 refills | Status: DC | PRN

## 2017-05-30 NOTE — Progress Notes (Signed)
She has less sinus congestion and pressure and less runny nose.  She still has some cough, tickle in the throat.  Still on abx, early in the treatment course.   Rash.  On abd wall.  Gets redder with working out.  Present for a few months, likely.  Slowly spreading on the abd wall.  Not itchy.  No FCNAVD.    Anxiety.  She has h/o anxiety in the past, worse in the last year.  "it's been rough."  "I like to handle things myself, but I probably need to talk to someone about this."  She feels run down.  Husband has noted.  Safe at home.  She is back in school, started a new job.  That is a more pressure for patient but she is more financially secure.  She worries constantly, initially about school and work then about the roof on her house, etc.  She worries more and more about more and mor topics.  Sleep is disrupted.  Concentration is disrupted.  No SI/HI.    She is getting her MBA.  She is working for a [employer].  She is working from home.    PMH and SH reviewed  ROS: Per HPI unless specifically indicated in ROS section   Meds, vitals, and allergies reviewed.   GEN: nad, alert and oriented, tearful but regains composure.   HEENT: mucous membranes moist, OP wnl, nasal exam stuffy NECK: supple w/o LA CV: rrr.  no murmur PULM: ctab, no inc wob ABD: soft, +bs EXT: no edema SKIN: likely fungal rash on the abd wall, small circular macular areas of faint irritation irregularly located on the abdominal wall in nondermatomal distribution.

## 2017-05-30 NOTE — Patient Instructions (Signed)
I put in the referral.  Call [phone].  If the wait is going to be too long, then they may be able to get you in at another clinic.   Use tessalon for cough.  Finish the antibiotics.  Start lexapro and update me in about 10 days, sooner if needed.  Use OTC ketoconazole on the rash.  That should help.

## 2017-05-31 ENCOUNTER — Encounter: Payer: Self-pay | Admitting: Family Medicine

## 2017-05-31 DIAGNOSIS — R21 Rash and other nonspecific skin eruption: Secondary | ICD-10-CM | POA: Insufficient documentation

## 2017-05-31 NOTE — Assessment & Plan Note (Addendum)
Discussed with patient about options. Okay for outpatient follow-up. No suicidal or homicidal intent. Start Lexapro 10 mg a day. Routine cautions and the timeline for response given to patient. She will update me. Okay to use Xanax with sedation caution in the meantime. Prescription given to patient. Routine benzodiazepine cautions discussed with patient, sedation caution.  Refer for counseling. >25 minutes spent in face to face time with patient, >50% spent in counselling or coordination of care.

## 2017-05-31 NOTE — Assessment & Plan Note (Signed)
She appears to be improving some. Nontoxic. Still bothered by cough. Lungs are clear. Okay to try Tessalon. Update [place] as needed.

## 2017-05-31 NOTE — Assessment & Plan Note (Signed)
Likely superficial fungal infection. Okay to use over-the-counter ketoconazole. Update me as needed.

## 2017-10-23 ENCOUNTER — Telehealth: Payer: Self-pay

## 2017-10-23 ENCOUNTER — Ambulatory Visit: Payer: Self-pay

## 2017-10-23 ENCOUNTER — Telehealth: Payer: Self-pay | Admitting: Family Medicine

## 2017-10-23 NOTE — Telephone Encounter (Signed)
Charted in error.

## 2017-10-23 NOTE — Telephone Encounter (Signed)
Can she at least come by to get a u/a done?  That would be helpful, even if she can't afford the OV.  Thanks.

## 2017-10-23 NOTE — Telephone Encounter (Signed)
Called pt. Offer two open appointments for today for c/o UTI symptoms. Pt. Reports she cannot afford office visit. States she will go to Urgent Care.

## 2017-10-23 NOTE — Telephone Encounter (Signed)
Left detailed message on voicemail to return call. 

## 2017-10-23 NOTE — Telephone Encounter (Signed)
   Reason for Disposition . [1] Painful urination AND [2] EITHER frequency or urgency AND [3] has on-call doctor  Answer Assessment - Initial Assessment Questions 1. SYMPTOM: "What's the main symptom you're concerned about?" (e.g., frequency, incontinence)     Urgency,burnining, voiding small amounts 2. ONSET: "When did the  ________  start?"     This morning 3. PAIN: "Is there any pain?" If so, ask: "How bad is it?" (Scale: 1-10; mild, moderate, severe)     2-3 MILD 4. CAUSE: "What do you think is causing the symptoms?"     Unknown 5. OTHER SYMPTOMS: "Do you have any other symptoms?" (e.g., fever, flank pain, blood in urine, pain with urination)     No 6. PREGNANCY: "Is there any chance you are pregnant?" "When was your last menstrual period?"     No  Protocols used: URINATION PAIN - FEMALE-A-AH, URINARY SYMPTOMS-A-AH  Pt. Requests doctor call something. Informed her this may not be possilbe without being seen. States she cannot afford office visit.

## 2017-12-10 ENCOUNTER — Ambulatory Visit: Payer: Self-pay

## 2017-12-10 DIAGNOSIS — R079 Chest pain, unspecified: Secondary | ICD-10-CM | POA: Diagnosis not present

## 2017-12-10 NOTE — Telephone Encounter (Signed)
  Reason for Disposition . [1] Sinus congestion (pressure, fullness) AND [2] present > 10 days  Answer Assessment - Initial Assessment Questions 1. LOCATION: "Where does it hurt?"      No sinus pain; chest discomfort left chest through to shoulder when takin s deep breath. 2. ONSET: "When did the sinus pain start?"  (e.g., hours, days)      Cold symptoms started 2 weeks ago. 3. SEVERITY: "How bad is the pain?"   (Scale 1-10; mild, moderate or severe)   - MILD (1-3): doesn't interfere with normal activities    - MODERATE (4-7): interferes with normal activities (e.g., work or school) or awakens from sleep   - SEVERE (8-10): excruciating pain and patient unable to do any normal activities        3 4. RECURRENT SYMPTOM: "Have you ever had sinus problems before?" If so, ask: "When was the last time?" and "What happened that time?"      Yes 5. NASAL CONGESTION: "Is the nose blocked?" If so, ask, "Can you open it or must you breathe through the mouth?"     Nose not blocked 6. NASAL DISCHARGE: "Do you have discharge from your nose?" If so ask, "What color?"     Yellow to clear 7. FEVER: "Do you have a fever?" If so, ask: "What is it, how was it measured, and when did it start?"      Last night and on and off 8. OTHER SYMPTOMS: "Do you have any other symptoms?" (e.g., sore throat, cough, earache, difficulty breathing)     Tired. Has pain when she takes a deep breath and with certain movements 9. PREGNANCY: "Is there any chance you are pregnant?" "When was your last menstrual period?"     No  Protocols used: SINUS PAIN OR CONGESTION-A-AH Pt. States she will go to urgent care due to availability issues and pt.'s schedule. Instructed to call if she feels worse. Verbalizes understanding.

## 2017-12-13 ENCOUNTER — Ambulatory Visit: Payer: Self-pay | Admitting: Hematology

## 2017-12-13 NOTE — Telephone Encounter (Signed)
Patient was seen at Urgent care on Tuesday with same symptoms and was told it could be stress related per patient.  Patient states the pain has not improved, it seems to be getting worse. Patient states she has been really stressed out.  Per protocol, appointment made appointment with provider.  Patient instructed when to seek emergency care and she verbalized understanding.  Appointment [facility] Reason for Disposition . [1] Chest pain lasts > 5 minutes AND [2] occurred > 3 days ago (72 hours) AND [3] NO chest pain or cardiac symptoms now  Answer Assessment - Initial Assessment Questions 1. LOCATION: "Where does it hurt?"       Left side 2. RADIATION: "Does the pain go anywhere else?" (e.g., into neck, jaw, arms, back)    Radiates into back 3. ONSET: "When did the chest pain begin?" (Minutes, hours or days)    Started on Monday 12/09/17 4. PATTERN "Does the pain come and go, or has it been constant since it started?"  "Does it get worse with exertion?"  constant  6. SEVERITY: "How bad is the pain?"  (e.g., Scale 1-10; mild, moderate, or severe)    - MILD (1-3): doesn't interfere with normal activities     - MODERATE (4-7): interferes with normal activities or awakens from sleep    - SEVERE (8-10): excruciating pain, unable to do any normal activities      With deep breaths the pain increases 7. CARDIAC RISK FACTORS: "Do you have any history of heart problems or risk factors for heart disease?" (e.g., prior heart attack, angina; high blood pressure, diabetes, being overweight, high cholesterol, smoking, or strong family history of heart disease)    No, quit smoking years ago, pericarditis in early 20's 8. PULMONARY RISK FACTORS: "Do you have any history of lung disease?"  (e.g., blood clots in lung, asthma, emphysema, birth control pills)   no 9. CAUSE: "What do you think is causing the chest pain?"     unknown 10. OTHER SYMPTOMS: "Do you have any other symptoms?" (e.g., dizziness, nausea,  vomiting, sweating, fever, difficulty breathing, cough)      No, has had a cold x2 weeks 11. PREGNANCY: "Is there any chance you are pregnant?" "When was your last menstrual period?"       NO  Protocols used: CHEST PAIN-A-AH

## 2017-12-14 ENCOUNTER — Ambulatory Visit: Payer: BLUE CROSS/BLUE SHIELD | Admitting: Family Medicine

## 2019-02-14 ENCOUNTER — Ambulatory Visit (INDEPENDENT_AMBULATORY_CARE_PROVIDER_SITE_OTHER): Payer: Self-pay | Admitting: Plastic Surgery

## 2019-02-14 ENCOUNTER — Encounter: Payer: Self-pay | Admitting: Plastic Surgery

## 2019-02-14 DIAGNOSIS — L989 Disorder of the skin and subcutaneous tissue, unspecified: Secondary | ICD-10-CM | POA: Insufficient documentation

## 2019-02-14 NOTE — Progress Notes (Signed)
Patient ID: Megan Pearson, female    DOB: 1986/01/18, 33 y.o.   MRN: [id number]   Chief Complaint  Patient presents with  . Advice Only    for mole removal    The patient is a 33 year old female here for evaluation of several changing skin lesions on her face.  She has a long history of moles.  She has not had skin cancer but does have a family history of skin cancer.  She has a mole on her forehead on the left, right cheek and left cheek.  They are all 5 mm at the largest in size and have some irregular components with hyperpigmentation.  They are all raised.  Nothing makes them better.  They seem to be getting larger with time.  She is a Fitzpatrick 2-3.  She is otherwise healthy.  She has been tobacco free for several months.    Review of Systems  Constitutional: Negative.   HENT: Negative.   Respiratory: Negative.   Cardiovascular: Negative.   Gastrointestinal: Negative.   Endocrine: Negative.   Genitourinary: Negative.   Musculoskeletal: Negative.   Skin: Negative.  Negative for wound.  Hematological: Negative for adenopathy.    Past Medical History:  Diagnosis Date  . Anxiety   . Ectopic pregnancy     Past Surgical History:  Procedure Laterality Date  . KNEE SURGERY Left    arthroscopy  . LYMPH NODE BIOPSY     benign, Left side of neck   . UPPER GI ENDOSCOPY        Current Outpatient Medications:  .  acetaminophen (TYLENOL) 325 MG tablet, Take 2 tablets (650 mg total) by mouth 3 (three) times daily as needed., Disp: , Rfl:  .  ALPRAZolam (XANAX) 0.25 MG tablet, Take 1 tablet (0.25 mg total) by mouth 2 (two) times daily as needed for anxiety (sedation caution)., Disp: 20 tablet, Rfl: 0 .  amoxicillin-clavulanate (AUGMENTIN) 875-125 MG tablet, Take 1 tablet by mouth 2 (two) times daily., Disp: 20 tablet, Rfl: 0 .  benzonatate (TESSALON) 200 MG capsule, Take 1 capsule (200 mg total) by mouth 3 (three) times daily as needed., Disp: 30 capsule, Rfl: 0 .   cyclobenzaprine (FLEXERIL) 10 MG tablet, Take 0.5-1 tablets (5-10 mg total) by mouth 3 (three) times daily as needed for muscle spasms (sedation caution)., Disp: 30 tablet, Rfl: 0 .  escitalopram (LEXAPRO) 10 MG tablet, Take 1 tablet (10 mg total) by mouth daily., Disp: 90 tablet, Rfl: 1 .  ranitidine (ZANTAC 75) 75 MG tablet, Take 1-2 tablets (75-150 mg total) by mouth 2 (two) times daily as needed for heartburn., Disp: , Rfl:    Objective:   Vitals:   02/14/19 1334  BP: 116/82  Pulse: 79  Temp: 98.4 F (36.9 C)  SpO2: 98%    Physical Exam Vitals signs and nursing note reviewed.  Constitutional:      Appearance: Normal appearance.  HENT:     Head: Normocephalic and atraumatic.     Right Ear: External ear normal.     Left Ear: External ear normal.  Pulmonary:     Effort: Pulmonary effort is normal.  Abdominal:     General: Abdomen is flat. There is no distension.     Tenderness: There is no abdominal tenderness.  Neurological:     General: No focal deficit present.     Mental Status: She is alert.  Psychiatric:        Mood and Affect: Mood normal.  Thought Content: Thought content normal.        Judgment: Judgment normal.     Assessment & Plan:  Changing skin lesion  Recommend excision of 4 changing skin lesions of her face.  We discussed that there will be scars.  [place], DO

## 2019-03-20 ENCOUNTER — Encounter: Payer: Self-pay | Admitting: Plastic Surgery

## 2019-03-20 ENCOUNTER — Other Ambulatory Visit: Payer: Self-pay

## 2019-03-20 ENCOUNTER — Ambulatory Visit: Payer: BLUE CROSS/BLUE SHIELD | Admitting: Plastic Surgery

## 2019-03-20 VITALS — BP 120/87 | HR 93 | Temp 98.5°F | Ht 71.0 in | Wt 208.4 lb

## 2019-03-20 DIAGNOSIS — L989 Disorder of the skin and subcutaneous tissue, unspecified: Secondary | ICD-10-CM

## 2019-03-20 NOTE — Progress Notes (Signed)
Preoperative Dx: changing skin lesions face  Postoperative Dx: Same  Procedure: changing skin lesion of face 1. Left cheek 1 x 1.5 cm 2. Forehead 4 mm 3. Right cheek medial 4 mm  4. Right cheek lateral 5 mm  Surgeon: Dr. Lyndee Leo Jonael Paradiso  Anesthesia: Lidocaine 1% with 1:100,000 epinepherine  Indication for Procedure: face skin lesions  Description of Procedure: Risks and complications were explained to the patient.  Consent was confirmed and signed.  Time out was called and all information was confirmed to be correct.  The area was prepped with betadine and drapped.  Lidocaine 1% with epinepherine was injected in the subcutaneous area.    Left cheek:  After waiting several minutes for the lidocaine to take affect a #15 blade was used to excise the 1 x 1.5 cm area in an eliptical pattern.  The deep layer was approximated with the 5-0 Monocryl.  The skin edges were reapproximated with 5-0 Monocryl subcuticular running closure.  Steri strips were applied.    Forehead:  After waiting several minutes for the lidocaine to take affect a #15 blade was used to excise the 4 mm area in an eliptical pattern.  The skin edges were reapproximated with 5-0 Monocryl subcuticular running closure.  Steri strips were applied.    Right cheek medial:  After waiting several minutes for the lidocaine to take affect a #15 blade was used to excise the 4 mm area in an eliptical pattern.  The skin edges were reapproximated with 5-0 Monocryl subcuticular running closure.  Steri strips were applied.    Right cheek lateralAfter waiting several minutes for the lidocaine to take affect a #15 blade was used to excise the 5 mm area in an eliptical pattern.  The skin edges were reapproximated with 5-0 Monocryl subcuticular running closure.  Steri strips were applied.    Derma bond was placed on the right and left cheek areas.  The patient is to follow up in one week.  She tolerated the procedure well and there were no  complications. The specimens were sent to pathology.

## 2019-03-20 NOTE — Addendum Note (Signed)
Addended by: Wallace Going on: 03/20/2019 02:52 PM   Modules accepted: Orders

## 2019-03-21 ENCOUNTER — Other Ambulatory Visit (HOSPITAL_COMMUNITY)
Admission: RE | Admit: 2019-03-21 | Discharge: 2019-03-21 | Disposition: A | Discharge status: Home / Self Care | Payer: BLUE CROSS/BLUE SHIELD | Source: Ambulatory Visit | Attending: Plastic Surgery | Admitting: Plastic Surgery

## 2019-03-21 DIAGNOSIS — L989 Disorder of the skin and subcutaneous tissue, unspecified: Secondary | ICD-10-CM | POA: Insufficient documentation

## 2019-03-21 DIAGNOSIS — D2239 Melanocytic nevi of other parts of face: Secondary | ICD-10-CM | POA: Diagnosis not present

## 2019-03-21 DIAGNOSIS — L720 Epidermal cyst: Secondary | ICD-10-CM | POA: Diagnosis not present

## 2019-03-21 DIAGNOSIS — L821 Other seborrheic keratosis: Secondary | ICD-10-CM | POA: Diagnosis not present

## 2019-03-21 NOTE — Addendum Note (Signed)
Addended by: Wallace Going on: 03/21/2019 08:39 AM   Modules accepted: Orders

## 2019-03-21 NOTE — Addendum Note (Signed)
Addended by: Wallace Going on: 03/21/2019 08:37 AM   Modules accepted: Orders

## 2019-03-28 ENCOUNTER — Encounter: Payer: BLUE CROSS/BLUE SHIELD | Admitting: Plastic Surgery

## 2019-03-28 ENCOUNTER — Other Ambulatory Visit: Payer: Self-pay

## 2019-03-28 ENCOUNTER — Encounter: Payer: Self-pay | Admitting: Plastic Surgery

## 2019-03-28 ENCOUNTER — Ambulatory Visit (INDEPENDENT_AMBULATORY_CARE_PROVIDER_SITE_OTHER): Payer: BLUE CROSS/BLUE SHIELD | Admitting: Plastic Surgery

## 2019-03-28 DIAGNOSIS — D229 Melanocytic nevi, unspecified: Secondary | ICD-10-CM | POA: Insufficient documentation

## 2019-03-28 DIAGNOSIS — D2239 Melanocytic nevi of other parts of face: Secondary | ICD-10-CM

## 2019-03-28 DIAGNOSIS — L821 Other seborrheic keratosis: Secondary | ICD-10-CM

## 2019-03-28 NOTE — Progress Notes (Signed)
The patient is a 33 yrs old wf here for follow up on her face skin lesions excision.  All areas healing nicely.  Sutures removed and dermabond applied.  Avoid sun.  Can start mederma and silicone sheets when dermabond off.  Sun block when outside. Path reviewed with patient. Follow up as needed.

## 2019-04-08 ENCOUNTER — Encounter: Payer: Self-pay | Admitting: Plastic Surgery

## 2019-04-08 ENCOUNTER — Ambulatory Visit (INDEPENDENT_AMBULATORY_CARE_PROVIDER_SITE_OTHER): Payer: BLUE CROSS/BLUE SHIELD | Admitting: Plastic Surgery

## 2019-04-08 ENCOUNTER — Other Ambulatory Visit: Payer: Self-pay

## 2019-04-08 VITALS — BP 121/82 | HR 82 | Temp 98.6°F | Ht 71.0 in | Wt 208.0 lb

## 2019-04-08 DIAGNOSIS — D2239 Melanocytic nevi of other parts of face: Secondary | ICD-10-CM

## 2019-04-08 NOTE — Progress Notes (Signed)
The patient is a 33 year old female here for follow-up after having excision of several changing skin lesions of her face.  They were melanocytic nevi.  They are all healing very well.  There is no sign of infection.  There were a few stitches remaining.  These were removed today.  Follow-up as needed.

## 2019-04-19 ENCOUNTER — Other Ambulatory Visit: Payer: Self-pay | Admitting: Family Medicine

## 2019-04-21 NOTE — Telephone Encounter (Signed)
Patient sends a my chart message requesting refill on her cyclobenzaprine.   Last Refill: #30, 0 refills on 06/23/16 LAST OV:  05/2017 NEXT OV: none scheduled   Patient's comment:   Hi Dr Damita Dunnings! Finally ran out of these. Only use them occasionally as I still experience back spasms following that tumble down the stairs. (Not often, usually only when I overdo it). Not sure if this requires a follow-up appt as it isn't outrageously troublesome, just painful when it happens. Let me know if this requires a visit. Thank you!  Will pend for Dr. Damita Dunnings to advise if patient should be seen for this and if can be set up for virtual visit for this complaint.

## 2019-04-22 MED ORDER — CYCLOBENZAPRINE HCL 10 MG PO TABS: 5.0000 mg | ORAL_TABLET | Freq: Three times a day (TID) | ORAL | 0 refills | Status: DC | PRN

## 2019-04-22 NOTE — Telephone Encounter (Signed)
Prescription sent.  If not better with this then needs follow-up.  Thanks.

## 2019-05-07 ENCOUNTER — Encounter: Payer: Self-pay | Admitting: Plastic Surgery

## 2019-05-07 ENCOUNTER — Other Ambulatory Visit: Payer: Self-pay

## 2019-05-07 ENCOUNTER — Ambulatory Visit (INDEPENDENT_AMBULATORY_CARE_PROVIDER_SITE_OTHER): Payer: BLUE CROSS/BLUE SHIELD | Admitting: Plastic Surgery

## 2019-05-07 VITALS — BP 129/87 | HR 90 | Temp 98.6°F | Ht 71.0 in | Wt 208.0 lb

## 2019-05-07 DIAGNOSIS — D2239 Melanocytic nevi of other parts of face: Secondary | ICD-10-CM

## 2019-05-07 NOTE — Progress Notes (Signed)
   Subjective:    Patient ID: Megan Pearson, female    DOB: October 01, 1986, 33 y.o.   MRN: [id number]  The patient is here for follow up on her left cheek excision.  There is a slight opening of the skin.  No sign of infection.     Review of Systems  Constitutional: Negative.   HENT: Negative.   Respiratory: Negative.   Cardiovascular: Negative.   Gastrointestinal: Negative.   Musculoskeletal: Negative.   Skin: Positive for wound.  Psychiatric/Behavioral: Negative.        Objective:   Physical Exam Vitals signs and nursing note reviewed.  Constitutional:      Appearance: Normal appearance.  HENT:     Head: Normocephalic.   Cardiovascular:     Rate and Rhythm: Normal rate.  Pulmonary:     Effort: Pulmonary effort is normal.  Neurological:     Mental Status: She is alert and oriented to person, place, and time.  Psychiatric:        Mood and Affect: Mood normal.        Behavior: Behavior normal.        Assessment & Plan:  Melanocytic nevus of face, other location  Donated acell applied.  Apply ky daily and follow up in one week.

## 2020-12-14 ENCOUNTER — Other Ambulatory Visit: Payer: Self-pay

## 2020-12-14 ENCOUNTER — Ambulatory Visit (INDEPENDENT_AMBULATORY_CARE_PROVIDER_SITE_OTHER): Payer: 59 | Admitting: Family Medicine

## 2020-12-14 ENCOUNTER — Encounter: Payer: Self-pay | Admitting: Family Medicine

## 2020-12-14 VITALS — BP 114/78 | HR 84 | Ht 71.0 in | Wt 216.0 lb

## 2020-12-14 DIAGNOSIS — F17200 Nicotine dependence, unspecified, uncomplicated: Secondary | ICD-10-CM

## 2020-12-14 DIAGNOSIS — Z0000 Encounter for general adult medical examination without abnormal findings: Secondary | ICD-10-CM

## 2020-12-14 DIAGNOSIS — Z7689 Persons encountering health services in other specified circumstances: Secondary | ICD-10-CM

## 2020-12-14 DIAGNOSIS — E66811 Obesity, class 1: Secondary | ICD-10-CM | POA: Insufficient documentation

## 2020-12-14 DIAGNOSIS — K219 Gastro-esophageal reflux disease without esophagitis: Secondary | ICD-10-CM | POA: Diagnosis not present

## 2020-12-14 DIAGNOSIS — F419 Anxiety disorder, unspecified: Secondary | ICD-10-CM | POA: Diagnosis not present

## 2020-12-14 DIAGNOSIS — H15103 Unspecified episcleritis, bilateral: Secondary | ICD-10-CM

## 2020-12-14 DIAGNOSIS — E669 Obesity, unspecified: Secondary | ICD-10-CM

## 2020-12-14 DIAGNOSIS — H5789 Other specified disorders of eye and adnexa: Secondary | ICD-10-CM

## 2020-12-14 NOTE — Addendum Note (Signed)
Addended byYong Channel, Manveer Gomes L on: 12/14/2020 10:15 AM   Modules accepted: Orders

## 2020-12-14 NOTE — Progress Notes (Signed)
Office Visit Note   Patient: Megan Pearson           Date of Birth: 08-26-1986           MRN: [id number] Visit Date: 12/14/2020 Requested by: Tonia Ghent, MD [address] [place],  [place] [address] PCP: Eunice Blase, MD  Subjective: Chief Complaint  Patient presents with  . establish primary care    Inflammation issue with both eyes - the right eye is still being treated    HPI: She is here to establish care.  She has not had a PCP in many years.  Her main reason for this visit is recurrent episcleritis.  For the past 5 years she has had intermittent episodes, usually lasting 1 or 2 days, but this year she has had almost every other month.  Most recently it has lasted almost 4 weeks and is finally resolving with topical steroids.  No personal or family history of rheumatologic disease to her knowledge.  She is not having any other unusual symptoms.  She has a history of GERD but this is manageable with avoidance of triggering foods.  She does not have issues with constipation or diarrhea on a regular basis.  She had Covid vaccine 6 months ago and a booster recently.  She has been on copper IUD for a while.  She is a smoker but is in the process of quitting, is down to 1 pack every 4 days.  Family history was updated.  Her father died of colon cancer in his 26s.  Patient has not had a colonoscopy.                ROS:   All other systems were reviewed and are negative.  Objective: Vital Signs: BP 114/78   Pulse 84   Ht 5\' 11"  (1.803 m)   Wt 216 lb (98 kg)   BMI 30.13 kg/m   Physical Exam:  General:  Alert and oriented, in no acute distress. Pulm:  Breathing unlabored. Psy:  Normal mood, congruent affect. Skin: No suspicious lesions HEENT:  [place]/AT, PERRLA, EOM Full, no nystagmus.  Funduscopic examination within normal limits.  Very slight episcleritis in the right eye.  Tympanic membranes are pearly gray with normal landmarks.  External ear canals are  normal.  Nasal passages are clear.  Oropharynx is clear.  No significant lymphadenopathy.  No thyromegaly or nodules.  2+ carotid pulses without bruits. CV: Regular rate and rhythm without murmurs, rubs, or gallops.  No peripheral edema.  2+ radial and posterior tibial pulses. Lungs: Clear to auscultation throughout with no wheezing or areas of consolidation. Abd: Bowel sounds are active, no hepatosplenomegaly or masses.  Soft and nontender.  No audible bruits.  No evidence of ascites. Extremities: No joint effusions, 2+ DTRs.  No nail deformities.    Imaging: No results found.  Assessment & Plan: 1.  Wellness exam  -Labs to evaluate overall health.   -Most likely will need colonoscopy when she turns 57.  2.  Recurrent episcleritis -Labs to look for autoimmune disease or rheumatologic disease  3.  Tobacco use - quitting.  4.  History of GERD - Consider elimination diet if AI labs are positive.     Procedures: No procedures performed        PMFS History: Patient Active Problem List   Diagnosis Date Noted  . Obesity (BMI 30.0-34.9) 12/14/2020  . Melanocytic nevus 03/28/2019  . Seborrheic keratoses 03/28/2019  . Rash 05/31/2017  .  Back pain 06/25/2016  . Acute non-recurrent maxillary sinusitis 05/16/2016  . Anxiety 05/16/2016  . Smoking 05/16/2016  . GERD (gastroesophageal reflux disease) 05/16/2016   Past Medical History:  Diagnosis Date  . Anxiety   . Ectopic pregnancy     Family History  Problem Relation Age of Onset  . Breast cancer Mother   . Hypertension Mother   . Cancer Mother   . Macular degeneration Mother   . GER disease Mother   . Colon cancer Father   . Cancer Father   . Hypertension Brother   . Obesity Brother   . Hypertension Maternal Grandmother   . Cancer Maternal Grandfather   . Skin cancer Maternal Grandfather   . Alcohol abuse Neg Hx   . Arthritis Neg Hx   . Asthma Neg Hx   . Birth defects Neg Hx   . COPD Neg Hx   . Depression Neg  Hx   . Diabetes Neg Hx   . Drug abuse Neg Hx   . Early death Neg Hx   . Hearing loss Neg Hx   . Heart disease Neg Hx   . Hyperlipidemia Neg Hx   . Kidney disease Neg Hx   . Learning disabilities Neg Hx   . Mental illness Neg Hx   . Mental retardation Neg Hx   . Miscarriages / Stillbirths Neg Hx   . Stroke Neg Hx   . Vision loss Neg Hx   . Varicose Veins Neg Hx     Past Surgical History:  Procedure Laterality Date  . KNEE SURGERY Left    arthroscopy  . LYMPH NODE BIOPSY     benign, Left side of neck   . UPPER GI ENDOSCOPY     Social History   Occupational History  . Not on file  Tobacco Use  . Smoking status: Former Smoker    Packs/day: 0.50    Years: 10.00    Pack years: 5.00    Types: Cigarettes  . Smokeless tobacco: Never Used  Substance and Sexual Activity  . Alcohol use: Not on file    Comment: rare  . Drug use: No  . Sexual activity: Yes    Birth control/protection: None

## 2020-12-16 ENCOUNTER — Telehealth: Payer: Self-pay | Admitting: Family Medicine

## 2020-12-16 LAB — URINALYSIS W MICROSCOPIC + REFLEX CULTURE
Bilirubin Urine: NEGATIVE
Glucose, UA: NEGATIVE
Ketones, ur: NEGATIVE
Leukocyte Esterase: NEGATIVE
Nitrites, Initial: NEGATIVE
Protein, ur: NEGATIVE
Specific Gravity, Urine: 1.023 (ref 1.001–1.03)
pH: 6 (ref 5.0–8.0)

## 2020-12-16 LAB — COMPREHENSIVE METABOLIC PANEL
AG Ratio: 1.6 (calc) (ref 1.0–2.5)
ALT: 13 U/L (ref 6–29)
AST: 23 U/L (ref 10–30)
Albumin: 4.3 g/dL (ref 3.6–5.1)
Alkaline phosphatase (APISO): 44 U/L (ref 31–125)
BUN: 10 mg/dL (ref 7–25)
CO2: 27 mmol/L (ref 20–32)
Calcium: 9.3 mg/dL (ref 8.6–10.2)
Chloride: 105 mmol/L (ref 98–110)
Creat: 0.72 mg/dL (ref 0.50–1.10)
Globulin: 2.7 g/dL (calc) (ref 1.9–3.7)
Glucose, Bld: 95 mg/dL (ref 65–99)
Potassium: 4.3 mmol/L (ref 3.5–5.3)
Sodium: 139 mmol/L (ref 135–146)
Total Bilirubin: 0.3 mg/dL (ref 0.2–1.2)
Total Protein: 7 g/dL (ref 6.1–8.1)

## 2020-12-16 LAB — CBC WITH DIFFERENTIAL/PLATELET
Absolute Monocytes: 885 cells/uL (ref 200–950)
Basophils Absolute: 63 cells/uL (ref 0–200)
Basophils Relative: 0.8 %
Eosinophils Absolute: 119 cells/uL (ref 15–500)
Eosinophils Relative: 1.5 %
HCT: 36.5 % (ref 35.0–45.0)
Hemoglobin: 12 g/dL (ref 11.7–15.5)
Lymphs Abs: 2473 cells/uL (ref 850–3900)
MCH: 28.8 pg (ref 27.0–33.0)
MCHC: 32.9 g/dL (ref 32.0–36.0)
MCV: 87.5 fL (ref 80.0–100.0)
MPV: 11.8 fL (ref 7.5–12.5)
Monocytes Relative: 11.2 %
Neutro Abs: 4361 cells/uL (ref 1500–7800)
Neutrophils Relative %: 55.2 %
Platelets: 192 10*3/uL (ref 140–400)
RBC: 4.17 10*6/uL (ref 3.80–5.10)
RDW: 12.5 % (ref 11.0–15.0)
Total Lymphocyte: 31.3 %
WBC: 7.9 10*3/uL (ref 3.8–10.8)

## 2020-12-16 LAB — THYROID PANEL WITH TSH
Free Thyroxine Index: 2.3 (ref 1.4–3.8)
T3 Uptake: 31 % (ref 22–35)
T4, Total: 7.5 ug/dL (ref 5.1–11.9)
TSH: 3.44 mIU/L

## 2020-12-16 LAB — SEDIMENTATION RATE: Sed Rate: 22 mm/h — ABNORMAL HIGH (ref 0–20)

## 2020-12-16 LAB — LIPID PANEL
Cholesterol: 184 mg/dL (ref <200)
HDL: 51 mg/dL (ref >=50)
LDL Cholesterol (Calc): 110 mg/dL (calc) — ABNORMAL HIGH
Non-HDL Cholesterol (Calc): 133 mg/dL (calc) — ABNORMAL HIGH (ref <130)
Total CHOL/HDL Ratio: 3.6 (calc) (ref <5.0)
Triglycerides: 121 mg/dL (ref <150)

## 2020-12-16 LAB — THYROID PEROXIDASE ANTIBODY: Thyroperoxidase Ab SerPl-aCnc: <1 IU/mL (ref <9)

## 2020-12-16 LAB — MPO/PR-3 (ANCA) ANTIBODIES
Myeloperoxidase Abs: <1 AI
Serine Protease 3: <1 AI

## 2020-12-16 LAB — ANA: Anti Nuclear Antibody (ANA): NEGATIVE

## 2020-12-16 LAB — C-REACTIVE PROTEIN: CRP: 9.8 mg/L — ABNORMAL HIGH (ref <8.0)

## 2020-12-16 LAB — NO CULTURE INDICATED

## 2020-12-16 LAB — CYCLIC CITRUL PEPTIDE ANTIBODY, IGG: Cyclic Citrullin Peptide Ab: <16 UNITS

## 2020-12-16 LAB — ANCA SCREEN W REFLEX TITER: ANCA Screen: NEGATIVE

## 2020-12-16 LAB — RHEUMATOID FACTOR: Rhuematoid fact SerPl-aCnc: <14 IU/mL (ref <14)

## 2020-12-16 NOTE — Telephone Encounter (Signed)
Labs show:  CRP (inflammation marker) is slightly elevated at 9.8.  Same with sed rate.    Lipids are borderline.  Urine overall looks ok.    All else looks good.  No obvious autoimmune issue and no clear-cut rheumatologic issue.

## 2022-04-06 DIAGNOSIS — Z01419 Encounter for gynecological examination (general) (routine) without abnormal findings: Secondary | ICD-10-CM | POA: Diagnosis not present

## 2022-04-06 DIAGNOSIS — Z720 Tobacco use: Secondary | ICD-10-CM | POA: Diagnosis not present

## 2022-04-06 DIAGNOSIS — Z6830 Body mass index (BMI) 30.0-30.9, adult: Secondary | ICD-10-CM | POA: Diagnosis not present

## 2022-04-06 DIAGNOSIS — Z124 Encounter for screening for malignant neoplasm of cervix: Secondary | ICD-10-CM | POA: Diagnosis not present

## 2022-04-06 DIAGNOSIS — Z1151 Encounter for screening for human papillomavirus (HPV): Secondary | ICD-10-CM | POA: Diagnosis not present

## 2022-04-06 DIAGNOSIS — Z130 Encounter for screening for diseases of the blood and blood-forming organs and certain disorders involving the immune mechanism: Secondary | ICD-10-CM | POA: Diagnosis not present

## 2022-04-06 LAB — HM PAP SMEAR: HM Pap smear: NORMAL

## 2022-04-24 ENCOUNTER — Emergency Department (HOSPITAL_BASED_OUTPATIENT_CLINIC_OR_DEPARTMENT_OTHER)
Admission: EM | Admit: 2022-04-24 | Discharge: 2022-04-24 | Disposition: A | Discharge status: Home / Self Care | Payer: BC Managed Care – PPO | Attending: Emergency Medicine | Admitting: Emergency Medicine

## 2022-04-24 ENCOUNTER — Other Ambulatory Visit: Payer: Self-pay

## 2022-04-24 ENCOUNTER — Encounter (HOSPITAL_BASED_OUTPATIENT_CLINIC_OR_DEPARTMENT_OTHER): Payer: Self-pay

## 2022-04-24 ENCOUNTER — Emergency Department (HOSPITAL_BASED_OUTPATIENT_CLINIC_OR_DEPARTMENT_OTHER): Payer: BC Managed Care – PPO

## 2022-04-24 DIAGNOSIS — H5509 Other forms of nystagmus: Secondary | ICD-10-CM | POA: Insufficient documentation

## 2022-04-24 DIAGNOSIS — R1110 Vomiting, unspecified: Secondary | ICD-10-CM | POA: Diagnosis not present

## 2022-04-24 DIAGNOSIS — R5383 Other fatigue: Secondary | ICD-10-CM | POA: Insufficient documentation

## 2022-04-24 DIAGNOSIS — R42 Dizziness and giddiness: Secondary | ICD-10-CM | POA: Diagnosis not present

## 2022-04-24 DIAGNOSIS — J341 Cyst and mucocele of nose and nasal sinus: Secondary | ICD-10-CM | POA: Diagnosis not present

## 2022-04-24 LAB — CBC WITH DIFFERENTIAL/PLATELET
Abs Immature Granulocytes: 0.03 10*3/uL (ref 0.00–0.07)
Basophils Absolute: 0 10*3/uL (ref 0.0–0.1)
Basophils Relative: 1 %
Eosinophils Absolute: 0.1 10*3/uL (ref 0.0–0.5)
Eosinophils Relative: 1 %
HCT: 39.8 % (ref 36.0–46.0)
Hemoglobin: 13.5 g/dL (ref 12.0–15.0)
Immature Granulocytes: 0 %
Lymphocytes Relative: 32 %
Lymphs Abs: 2.8 10*3/uL (ref 0.7–4.0)
MCH: 29.3 pg (ref 26.0–34.0)
MCHC: 33.9 g/dL (ref 30.0–36.0)
MCV: 86.3 fL (ref 80.0–100.0)
Monocytes Absolute: 0.8 10*3/uL (ref 0.1–1.0)
Monocytes Relative: 9 %
Neutro Abs: 5 10*3/uL (ref 1.7–7.7)
Neutrophils Relative %: 57 %
Platelets: 223 10*3/uL (ref 150–400)
RBC: 4.61 MIL/uL (ref 3.87–5.11)
RDW: 12.1 % (ref 11.5–15.5)
WBC: 8.7 10*3/uL (ref 4.0–10.5)
nRBC: 0 % (ref 0.0–0.2)

## 2022-04-24 LAB — COMPREHENSIVE METABOLIC PANEL
ALT: 12 U/L (ref 0–44)
AST: 14 U/L — ABNORMAL LOW (ref 15–41)
Albumin: 4.3 g/dL (ref 3.5–5.0)
Alkaline Phosphatase: 45 U/L (ref 38–126)
Anion gap: 7 (ref 5–15)
BUN: 9 mg/dL (ref 6–20)
CO2: 23 mmol/L (ref 22–32)
Calcium: 9 mg/dL (ref 8.9–10.3)
Chloride: 109 mmol/L (ref 98–111)
Creatinine, Ser: 0.75 mg/dL (ref 0.44–1.00)
GFR, Estimated: >60 mL/min (ref >60)
Glucose, Bld: 97 mg/dL (ref 70–99)
Potassium: 3.7 mmol/L (ref 3.5–5.1)
Sodium: 139 mmol/L (ref 135–145)
Total Bilirubin: 0.6 mg/dL (ref 0.3–1.2)
Total Protein: 7.8 g/dL (ref 6.5–8.1)

## 2022-04-24 LAB — TROPONIN I (HIGH SENSITIVITY): Troponin I (High Sensitivity): <2 ng/L (ref <18)

## 2022-04-24 LAB — HCG, SERUM, QUALITATIVE: Preg, Serum: NEGATIVE

## 2022-04-24 MED ORDER — SODIUM CHLORIDE 0.9 % IV BOLUS
1000.0000 mL | Freq: Once | INTRAVENOUS | Status: AC
  Administered 2022-04-24: 1000 mL via INTRAVENOUS

## 2022-04-24 MED ORDER — IOHEXOL 350 MG/ML SOLN
75.0000 mL | Freq: Once | INTRAVENOUS | Status: AC | PRN
Start: 2022-04-24 — End: 2022-04-24
  Administered 2022-04-24: 75 mL via INTRAVENOUS

## 2022-04-24 MED ORDER — ONDANSETRON 4 MG PO TBDP: 4.0000 mg | ORAL_TABLET | Freq: Three times a day (TID) | ORAL | 0 refills | Status: DC | PRN

## 2022-04-24 MED ORDER — ONDANSETRON HCL 4 MG/2ML IJ SOLN
4.0000 mg | Freq: Once | INTRAMUSCULAR | Status: AC
  Administered 2022-04-24: 4 mg via INTRAVENOUS
  Filled 2022-04-24: qty 2

## 2022-04-24 MED ORDER — DIAZEPAM 2 MG PO TABS
2.0000 mg | ORAL_TABLET | Freq: Once | ORAL | Status: AC
  Administered 2022-04-24: 2 mg via ORAL
  Filled 2022-04-24: qty 1

## 2022-04-24 MED ORDER — DIAZEPAM 2 MG PO TABS: 2.0000 mg | ORAL_TABLET | Freq: Three times a day (TID) | ORAL | 0 refills | Status: DC | PRN

## 2022-04-24 MED ORDER — MECLIZINE HCL 25 MG PO TABS
25.0000 mg | ORAL_TABLET | Freq: Three times a day (TID) | ORAL | 0 refills | Status: DC | PRN
Start: 2022-04-24 — End: 2023-03-02

## 2022-04-24 MED ORDER — MECLIZINE HCL 25 MG PO TABS
25.0000 mg | ORAL_TABLET | Freq: Once | ORAL | Status: AC
Start: 2022-04-24 — End: 2022-04-24
  Administered 2022-04-24: 25 mg via ORAL
  Filled 2022-04-24: qty 1

## 2022-04-24 NOTE — ED Triage Notes (Signed)
C/o vertigo x 12 days, states room is spinning. Hx of same. Took meclizine which helped slightly. States a few episodes of emesis.  ?

## 2022-04-24 NOTE — ED Provider Notes (Signed)
[facility] EMERGENCY DEPARTMENT Provider Note   CSN: [id number] Arrival date & time: 04/24/22  1356     History  Chief Complaint  Patient presents with   Dizziness    Megan Pearson is a 36 y.o. female.  Patient complains of "vertigo" that has been intermittent for the past 10 to 12 days.  She describes room spinning and feeling off balance that is worse when she moves her head and changes position.  Symptoms never really go away but are better when she is lying still.  Had several episodes of vomiting yesterday but none today.  Did start Ozempic about 1 week ago but states eating and drinking well.  Had a similar episode of vertigo 6 months ago and lasted for 5 days resolved on its own.  She denies any headache.  She denies any focal weakness, numbness or tingling.  No fever.  No difficulty speaking or difficulty swallowing.  No chest pain or shortness of breath.  No visual changes.  She feels off balance when she walks and has spinning feeling and feels like she is off balance.  Denies any history of diabetes or hypertension Told she had inner ear problems at one point.  The history is provided by the patient.  Dizziness Associated symptoms: no headaches, no nausea, no shortness of breath, no vomiting and no weakness       Home Medications Prior to Admission medications   Medication Sig Start Date End Date Taking? Authorizing Provider  [facility]) IUD IUD ParaGard T 380A 380 square mm intrauterine device  Take 1 device by intrauterine route.    [provider]  cyclobenzaprine (FLEXERIL) 10 MG tablet Take 0.5-1 tablets (5-10 mg total) by mouth 3 (three) times daily as needed for muscle spasms (sedation caution). 04/22/19   Joaquim Nam, MD  dexamethasone (DECADRON) 0.1 % ophthalmic solution dexamethasone sodium phosphate 0.1 % eye drops  INSTILL 1 DROP IN RIGHT EYE FOUR TIMES DAILY Patient not taking: Reported on 12/14/2020    [provider]   Difluprednate 0.05 % EMUL INSTILL 1 DROP IN RIGHT EYE FOUR TIMES DAILY 12/10/20   [provider]  ibuprofen (ADVIL) 200 MG tablet Take 200 mg by mouth every 6 (six) hours as needed.    [provider]  neomycin-polymyxin b-dexamethasone (MAXITROL) 3.5-10000-0.1 OINT SMARTSIG:1 Sparingly Right Eye Every Night Patient not taking: Reported on 12/14/2020 12/06/20   [provider]  prednisoLONE acetate (PRED FORTE) 1 % ophthalmic suspension prednisolone acetate 1 % eye drops,suspension  PLACE 1 DROP INTO LEFT EYE 4 TIMES A DAY Patient not taking: Reported on 12/14/2020    [provider]      Allergies    Patient has no known allergies.    Review of Systems   Review of Systems  Constitutional:  Negative for activity change, appetite change and fever.  HENT:  Negative for congestion and rhinorrhea.   Eyes:  Negative for visual disturbance.  Respiratory:  Negative for chest tightness and shortness of breath.   Gastrointestinal:  Negative for abdominal pain, nausea and vomiting.  Genitourinary:  Negative for dysuria and hematuria.  Musculoskeletal:  Negative for arthralgias and myalgias.  Skin:  Negative for rash.  Neurological:  Positive for dizziness. Negative for weakness and headaches.   all other systems are negative except as noted in the HPI and PMH.   Physical Exam Updated Vital Signs BP (!) 147/97 (BP Location: Right Arm)   Pulse (!) 106   Temp  98.4 F (36.9 C) (Oral)   Resp 20   Ht 5\' 11"  (1.803 m)   Wt 97.5 kg   LMP 04/14/2022 (Approximate)   SpO2 100%   BMI 29.99 kg/m  Physical Exam Vitals and nursing note reviewed.  Constitutional:      General: She is not in acute distress.    Appearance: She is well-developed. She is not ill-appearing.  HENT:     Head: Normocephalic and atraumatic.     Right Ear: Tympanic membrane and ear canal normal.     Left Ear: Tympanic membrane and ear canal normal.     Mouth/Throat:     Pharynx: No  oropharyngeal exudate.  Eyes:     Conjunctiva/sclera: Conjunctivae normal.     Pupils: Pupils are equal, round, and reactive to light.  Neck:     Comments: No meningismus. Cardiovascular:     Rate and Rhythm: Normal rate and regular rhythm.     Heart sounds: Normal heart sounds. No murmur heard. Pulmonary:     Effort: Pulmonary effort is normal. No respiratory distress.     Breath sounds: Normal breath sounds.  Abdominal:     Palpations: Abdomen is soft.     Tenderness: There is no abdominal tenderness. There is no guarding or rebound.  Musculoskeletal:        General: No tenderness. Normal range of motion.     Cervical back: Normal range of motion and neck supple.  Skin:    General: Skin is warm.  Neurological:     Mental Status: She is alert and oriented to person, place, and time.     Cranial Nerves: No cranial nerve deficit.     Motor: No abnormal muscle tone.     Coordination: Coordination normal.     Comments: CN 2-12 intact, no ataxia on finger to nose, no nystagmus, 5/5 strength throughout, no pronator drift,  Positive Romberg, ataxic gait.  No ataxia on finger-to-nose. Some fatigable nystagmus horizontally on the left. Head impulse testing and test of skew negative.  Psychiatric:        Behavior: Behavior normal.    ED Results / Procedures / Treatments   Labs (all labs ordered are listed, but only abnormal results are displayed) Labs Reviewed  COMPREHENSIVE METABOLIC PANEL - Abnormal; Notable for the following components:      Result Value   AST 14 (*)    All other components within normal limits  CBC WITH DIFFERENTIAL/PLATELET  HCG, SERUM, QUALITATIVE  TROPONIN I (HIGH SENSITIVITY)    EKG EKG Interpretation  Date/Time:  Monday Apr 24 2022 15:20:41 EDT Ventricular Rate:  77 PR Interval:  151 QRS Duration: 96 QT Interval:  361 QTC Calculation: 409 R Axis:   91 Text Interpretation: Sinus rhythm Borderline right axis deviation Borderline repolarization  abnormality No previous ECGs available Confirmed by Glynn Octave [phone]) on 04/24/2022 3:35:52 PM  Radiology CT ANGIO HEAD NECK W WO CM  Result Date: 04/24/2022 CLINICAL DATA:  Central vertigo EXAM: CT ANGIOGRAPHY HEAD AND NECK TECHNIQUE: Multidetector CT imaging of the head and neck was performed using the standard protocol during bolus administration of intravenous contrast. Multiplanar CT image reconstructions and MIPs were obtained to evaluate the vascular anatomy. Carotid stenosis measurements (when applicable) are obtained utilizing NASCET criteria, using the distal internal carotid diameter as the denominator. RADIATION DOSE REDUCTION: This exam was performed according to the departmental dose-optimization program which includes automated exposure control, adjustment of the mA and/or kV according to patient size and/or  use of iterative reconstruction technique. CONTRAST:  75mL OMNIPAQUE IOHEXOL 350 MG/ML SOLN COMPARISON:  None. FINDINGS: CT HEAD FINDINGS Brain: There is no acute intracranial hemorrhage, extra-axial fluid collection, or acute infarct Parenchymal volume is normal. The ventricles are normal in size. Gray-white differentiation is preserved There is no mass lesion.  There is no mass effect or midline shift. Vascular: See below. Skull: Normal. Negative for fracture or focal lesion. Sinuses: There is a small retention cyst in the right maxillary sinus. Orbits: The globes and orbits are unremarkable. Review of the MIP images confirms the above findings CTA NECK FINDINGS Aortic arch: The aortic arch is normal. The origins of the major branch vessels are patent. Subclavian arteries are patent to the level imaged. Right carotid system: The right common, internal, and external carotid arteries are patent, without hemodynamically significant stenosis or occlusion. There is no dissection or aneurysm. Left carotid system: The left common, internal, and external carotid arteries are patent, without  hemodynamically significant stenosis or occlusion. There is no dissection or aneurysm. Vertebral arteries: Vertebral arteries are patent, without hemodynamically significant stenosis or occlusion. There is no dissection or aneurysm. Skeleton: There is no acute osseous abnormality or suspicious osseous lesion. There is no visible canal hematoma. Other neck: Soft tissues are unremarkable. Upper chest: The imaged lung apices are clear. Review of the MIP images confirms the above findings CTA HEAD FINDINGS Anterior circulation: The intracranial ICAs are patent. Bilateral MCAs are patent. The bilateral ACAs are patent. The anterior communicating artery is normal. There is no aneurysm or AVM. Posterior circulation: The bilateral V4 segments are patent.  The basilar artery is patent. The bilateral PCAs are patent. Bilateral posterior communicating arteries are identified, left larger than right. There is no aneurysm or AVM. Venous sinuses: Patent. Anatomic variants: None. Review of the MIP images confirms the above findings IMPRESSION: 1. Normal noncontrast head CT with no acute intracranial pathology. 2. Normal CTA of the head and neck with no stenosis, dissection, or occlusion. Electronically Signed   By: Lesia Hausen M.D.   On: 04/24/2022 17:25    Procedures Procedures    Medications Ordered in ED Medications  sodium chloride 0.9 % bolus 1,000 mL (has no administration in time range)  ondansetron (ZOFRAN) injection 4 mg (has no administration in time range)  meclizine (ANTIVERT) tablet 25 mg (has no administration in time range)  diazepam (VALIUM) tablet 2 mg (has no administration in time range)    ED Course/ Medical Decision Making/ A&P                           Medical Decision Making Amount and/or Complexity of Data Reviewed Labs: ordered. Radiology: ordered. ECG/medicine tests: ordered.  Risk Prescription drug management.  12 days of room spinning dizziness and feeling off balance.   Positive Romberg and ataxic gait.  Otherwise neurologically intact.  Imaging will be obtained given ongoing symptoms for several weeks.  She reports a history of vertigo several months ago but has no stroke risk factors.  Lower Suspicion for acute CVA however given her lack of risk factors Labs reassuring. HCG is negative.  Orthostatics are positive by heart rate. IV fluids given  CTA shows no large vessel occlusion.  No vertebral artery stenosis or basilar artery stenosis.  No evidence of large stroke. Results reviewed and interpreted by me.  Patient feels improved after treatment with antiemetics and meclizine.  She is able to ambulate.  She denies any  further nausea but still feels intermittently dizzy.  Discussed that MRI is a better test to rule out stroke which not available at this facility.  She does not want to be transferred for MRI today and agrees with outpatient follow-up.  She understands that MRI is the better test and it is possible a small stroke could be missed without getting an MRI.  She appears to have capacity to make this decision  Patient to follow-up with PCP as well as ENT.  She declines transfer for MRI tonight. She understands there is a small possibility of a stroke could be missed but seems less likely at this time  Return precautions discussed Advised not to drive or operate heavy machinery while taking benzodiazepines.         Final Clinical Impression(s) / ED Diagnoses Final diagnoses:  Vertigo    Rx / DC Orders ED Discharge Orders     None         Bethenny Losee, Jeannett Senior, MD 04/24/22 1759

## 2022-04-24 NOTE — Discharge Instructions (Signed)
Your testing today is reassuring.  Your CT scan shows no evidence of blocked blood vessels but you declined transfer for MRI today.  We do not favor stroke at this time.  Take the dizziness medication as prescribed as well as the nausea medication. ?Follow-up with your primary doctor and the ENT doctors.  Return to the ED with new or worsening symptoms. ?

## 2022-04-24 NOTE — ED Notes (Signed)
Ambulated to bathroom with steady gait

## 2022-04-24 NOTE — ED Notes (Signed)
Pt  ambulating with steady gait.  Provided ice water to evaluate po tolerance. ?

## 2022-04-24 NOTE — ED Notes (Signed)
Orthostatic vital signs completed on pt. Pt reported no more dizziness than "usual".  ?

## 2022-04-26 ENCOUNTER — Telehealth: Payer: Self-pay | Admitting: *Deleted

## 2022-04-26 NOTE — Telephone Encounter (Signed)
Pt husband called regarding pt being referred to Dr Janace Hoard, who no longer sees ER pt in outpatient setting.  RNCM placed referral manually and called [facility] to expedite follow-up appointment as pt is extremely uncomfortable and unable to work.  ? ?Appointment staff at [facility] explained that they scheduled appt for pt in future but also notified MD of pt condition and need for sooner appt.  Staff stated that once the request is reviewed, she will notify the pt of outcome.  RNCM relayed information to pt husband. ?

## 2022-08-02 DIAGNOSIS — J301 Allergic rhinitis due to pollen: Secondary | ICD-10-CM | POA: Diagnosis not present

## 2022-08-02 DIAGNOSIS — H8111 Benign paroxysmal vertigo, right ear: Secondary | ICD-10-CM | POA: Diagnosis not present

## 2022-08-02 DIAGNOSIS — H6983 Other specified disorders of Eustachian tube, bilateral: Secondary | ICD-10-CM | POA: Diagnosis not present

## 2022-10-12 DIAGNOSIS — H5213 Myopia, bilateral: Secondary | ICD-10-CM | POA: Diagnosis not present

## 2022-10-12 DIAGNOSIS — H52223 Regular astigmatism, bilateral: Secondary | ICD-10-CM | POA: Diagnosis not present

## 2022-10-12 DIAGNOSIS — H1045 Other chronic allergic conjunctivitis: Secondary | ICD-10-CM | POA: Diagnosis not present

## 2022-11-02 DIAGNOSIS — L821 Other seborrheic keratosis: Secondary | ICD-10-CM | POA: Diagnosis not present

## 2022-11-02 DIAGNOSIS — D2239 Melanocytic nevi of other parts of face: Secondary | ICD-10-CM | POA: Diagnosis not present

## 2023-01-01 DIAGNOSIS — R208 Other disturbances of skin sensation: Secondary | ICD-10-CM | POA: Diagnosis not present

## 2023-01-01 DIAGNOSIS — L538 Other specified erythematous conditions: Secondary | ICD-10-CM | POA: Diagnosis not present

## 2023-01-01 DIAGNOSIS — D2239 Melanocytic nevi of other parts of face: Secondary | ICD-10-CM | POA: Diagnosis not present

## 2023-01-01 DIAGNOSIS — L298 Other pruritus: Secondary | ICD-10-CM | POA: Diagnosis not present

## 2023-01-15 DIAGNOSIS — H15009 Unspecified scleritis, unspecified eye: Secondary | ICD-10-CM | POA: Insufficient documentation

## 2023-01-21 DIAGNOSIS — Z6829 Body mass index (BMI) 29.0-29.9, adult: Secondary | ICD-10-CM | POA: Diagnosis not present

## 2023-01-21 DIAGNOSIS — J020 Streptococcal pharyngitis: Secondary | ICD-10-CM | POA: Diagnosis not present

## 2023-01-21 DIAGNOSIS — J029 Acute pharyngitis, unspecified: Secondary | ICD-10-CM | POA: Diagnosis not present

## 2023-01-22 ENCOUNTER — Encounter (HOSPITAL_BASED_OUTPATIENT_CLINIC_OR_DEPARTMENT_OTHER): Payer: Self-pay

## 2023-01-22 ENCOUNTER — Emergency Department (HOSPITAL_BASED_OUTPATIENT_CLINIC_OR_DEPARTMENT_OTHER)
Admission: EM | Admit: 2023-01-22 | Discharge: 2023-01-22 | Disposition: A | Discharge status: Home / Self Care | Payer: BC Managed Care – PPO | Attending: Emergency Medicine | Admitting: Emergency Medicine

## 2023-01-22 ENCOUNTER — Emergency Department (HOSPITAL_BASED_OUTPATIENT_CLINIC_OR_DEPARTMENT_OTHER): Payer: BC Managed Care – PPO

## 2023-01-22 ENCOUNTER — Other Ambulatory Visit: Payer: Self-pay

## 2023-01-22 DIAGNOSIS — J029 Acute pharyngitis, unspecified: Secondary | ICD-10-CM | POA: Diagnosis not present

## 2023-01-22 DIAGNOSIS — J020 Streptococcal pharyngitis: Secondary | ICD-10-CM | POA: Insufficient documentation

## 2023-01-22 DIAGNOSIS — R1310 Dysphagia, unspecified: Secondary | ICD-10-CM | POA: Diagnosis not present

## 2023-01-22 DIAGNOSIS — R220 Localized swelling, mass and lump, head: Secondary | ICD-10-CM | POA: Diagnosis not present

## 2023-01-22 DIAGNOSIS — J039 Acute tonsillitis, unspecified: Secondary | ICD-10-CM | POA: Diagnosis not present

## 2023-01-22 LAB — CBC WITH DIFFERENTIAL/PLATELET
Abs Immature Granulocytes: 0.08 10*3/uL — ABNORMAL HIGH (ref 0.00–0.07)
Basophils Absolute: 0.1 10*3/uL (ref 0.0–0.1)
Basophils Relative: 0 %
Eosinophils Absolute: 0.1 10*3/uL (ref 0.0–0.5)
Eosinophils Relative: 0 %
HCT: 35.7 % — ABNORMAL LOW (ref 36.0–46.0)
Hemoglobin: 12.3 g/dL (ref 12.0–15.0)
Immature Granulocytes: 1 %
Lymphocytes Relative: 10 %
Lymphs Abs: 1.7 10*3/uL (ref 0.7–4.0)
MCH: 28.8 pg (ref 26.0–34.0)
MCHC: 34.5 g/dL (ref 30.0–36.0)
MCV: 83.6 fL (ref 80.0–100.0)
Monocytes Absolute: 1.8 10*3/uL — ABNORMAL HIGH (ref 0.1–1.0)
Monocytes Relative: 11 %
Neutro Abs: 13.7 10*3/uL — ABNORMAL HIGH (ref 1.7–7.7)
Neutrophils Relative %: 78 %
Platelets: 158 10*3/uL (ref 150–400)
RBC: 4.27 MIL/uL (ref 3.87–5.11)
RDW: 12.9 % (ref 11.5–15.5)
WBC: 17.4 10*3/uL — ABNORMAL HIGH (ref 4.0–10.5)
nRBC: 0 % (ref 0.0–0.2)

## 2023-01-22 LAB — COMPREHENSIVE METABOLIC PANEL
ALT: 13 U/L (ref 0–44)
AST: 13 U/L — ABNORMAL LOW (ref 15–41)
Albumin: 4 g/dL (ref 3.5–5.0)
Alkaline Phosphatase: 62 U/L (ref 38–126)
Anion gap: 11 (ref 5–15)
BUN: 11 mg/dL (ref 6–20)
CO2: 21 mmol/L — ABNORMAL LOW (ref 22–32)
Calcium: 8.5 mg/dL — ABNORMAL LOW (ref 8.9–10.3)
Chloride: 99 mmol/L (ref 98–111)
Creatinine, Ser: 0.71 mg/dL (ref 0.44–1.00)
GFR, Estimated: >60 mL/min (ref >60)
Glucose, Bld: 95 mg/dL (ref 70–99)
Potassium: 3.6 mmol/L (ref 3.5–5.1)
Sodium: 131 mmol/L — ABNORMAL LOW (ref 135–145)
Total Bilirubin: 0.6 mg/dL (ref 0.3–1.2)
Total Protein: 8 g/dL (ref 6.5–8.1)

## 2023-01-22 LAB — LACTIC ACID, PLASMA: Lactic Acid, Venous: 0.9 mmol/L (ref 0.5–1.9)

## 2023-01-22 MED ORDER — DEXAMETHASONE SODIUM PHOSPHATE 10 MG/ML IJ SOLN
10.0000 mg | Freq: Once | INTRAMUSCULAR | Status: AC
  Administered 2023-01-22: 10 mg via INTRAVENOUS
  Filled 2023-01-22: qty 1

## 2023-01-22 MED ORDER — TRAMADOL HCL 50 MG PO TABS: 50.0000 mg | ORAL_TABLET | Freq: Four times a day (QID) | ORAL | 0 refills | Status: DC | PRN

## 2023-01-22 MED ORDER — LIDOCAINE VISCOUS HCL 2 % MT SOLN
15.0000 mL | Freq: Once | OROMUCOSAL | Status: AC
  Administered 2023-01-22: 15 mL via OROMUCOSAL
  Filled 2023-01-22: qty 15

## 2023-01-22 MED ORDER — MORPHINE SULFATE (PF) 4 MG/ML IV SOLN
4.0000 mg | Freq: Once | INTRAVENOUS | Status: AC
  Administered 2023-01-22: 4 mg via INTRAVENOUS
  Filled 2023-01-22: qty 1

## 2023-01-22 MED ORDER — TRAMADOL HCL 50 MG PO TABS
50.0000 mg | ORAL_TABLET | Freq: Once | ORAL | Status: AC
  Administered 2023-01-22: 50 mg via ORAL
  Filled 2023-01-22: qty 1

## 2023-01-22 MED ORDER — IOHEXOL 300 MG/ML  SOLN
75.0000 mL | Freq: Once | INTRAMUSCULAR | Status: AC | PRN
  Administered 2023-01-22: 75 mL via INTRAVENOUS

## 2023-01-22 NOTE — ED Provider Triage Note (Signed)
Emergency Medicine Provider Triage Evaluation Note  Megan Pearson , a 37 y.o. female  was evaluated in triage.  Pt complains of sore throat, diagnosed with strep has been on antibiotics for the past 4 days without improvement. Was sent over from [facility] for further evaluation. Patient reports significant pain and trouble controlling her secretions.  Review of Systems  Positive:  Negative:   Physical Exam  BP (!) 134/93   Pulse (!) 109   Temp 99.6 F (37.6 C) (Oral)   Resp 18   Ht '5\' 11"'$  (1.803 m)   Wt 97.5 kg   LMP 01/08/2023 (Exact Date)   SpO2 97%   BMI 29.99 kg/m  Gen:   Awake, no distress   Resp:  Normal effort  MSK:   Moves extremities without difficulty  Other:  Maintaining her secretions. "Hot potato" voice. She has significant bialteral tonsullar edema and erythema with exudate. L does appear slightly larger than right. Question midline uvula. She has swelling on the right side of her anterior neck. No Ludwig sign.   Medical Decision Making  Medically screening exam initiated at 8:00 PM.  Appropriate orders placed.  Megan Pearson was informed that the remainder of the evaluation will be completed by another provider, this initial triage assessment does not replace that evaluation, and the importance of remaining in the ED until their evaluation is complete.  Decadron, labs, and CT ordered   Megan Pearson, Hershal Coria 01/22/23 2003

## 2023-01-22 NOTE — Discharge Instructions (Signed)
You were seen in the emergency department for strep throat. The steroids we gave you here should help improve symptoms over the next 24-48 hours. Continue taking penicillin. I have sent a prescription for Tramadol which is a pain medication.  You have been prescribed a medication that is considered an opiate. Opiates are pain medications that should be used with caution. It is important that you do not drive while taking this medication as it can cause drowsiness and impaired reaction times. Do not mix this medication with benzodiazepine medications or alcohol as this can cause respiratory depression. Additionally, opiates have addicting properties to them. Please use medication as prescribed by your provider. Please return to the ER for inability to swallow liquids or continued worsening of your symptoms.

## 2023-01-22 NOTE — ED Triage Notes (Signed)
Sore throat since Friday night. [facility] and [facility]. Took penicillin and is not feeling better. Went to [facility] who told pt to come here. Pt reports she cannot eat and drink well and s having difficulty swallowing. Pt tearful in triage. Took penicillin (12pm) and ibuprofen (3pm) today

## 2023-01-22 NOTE — ED Provider Notes (Signed)
[facility] [facility] [facility] Provider Note   CSN: [id number] Arrival date & time: 01/22/23  1929     History  Chief Complaint  Patient presents with   Sore Throat    Megan Pearson is a 37 y.o. female. With past medical history of anxiety, obesity, who presents to the emergency department with sore throat.  States she was diagnosed with strep throat 2 days ago and placed on penicillin. She is having ongoing difficulty eating or drinking liquids. She states she has been able to tolerate very small amounts of water at at time but is intermittently drooling. States she has not really been able to sleep over the past 2 days two to pain. Has been trying cough drops and over the counter pain control with no relief in symptoms.    Sore Throat       Home Medications Prior to Admission medications   Medication Sig Start Date End Date Taking? Authorizing Provider  traMADol (ULTRAM) 50 MG tablet Take 1 tablet (50 mg total) by mouth every 6 (six) hours as needed. 01/22/23  Yes Mickie Hillier, PA-C  [facility]) IUD IUD ParaGard T 380A 380 square mm intrauterine device  Take 1 device by intrauterine route.    [provider]  cyclobenzaprine (FLEXERIL) 10 MG tablet Take 0.5-1 tablets (5-10 mg total) by mouth 3 (three) times daily as needed for muscle spasms (sedation caution). 04/22/19   Tonia Ghent, MD  dexamethasone (DECADRON) 0.1 % ophthalmic solution dexamethasone sodium phosphate 0.1 % eye drops  INSTILL 1 DROP IN RIGHT EYE FOUR TIMES DAILY Patient not taking: Reported on 12/14/2020    [provider]  diazepam (VALIUM) 2 MG tablet Take 1 tablet (2 mg total) by mouth every 8 (eight) hours as needed for anxiety. 04/24/22   Rancour, Annie Main, MD  Difluprednate 0.05 % EMUL INSTILL 1 DROP IN RIGHT EYE FOUR TIMES DAILY 12/10/20   [provider]  ibuprofen (ADVIL) 200 MG tablet Take 200 mg by mouth every 6 (six) hours as needed.     [provider]  meclizine (ANTIVERT) 25 MG tablet Take 1 tablet (25 mg total) by mouth 3 (three) times daily as needed for dizziness. 04/24/22   Rancour, Annie Main, MD  neomycin-polymyxin b-dexamethasone (MAXITROL) 3.5-10000-0.1 OINT SMARTSIG:1 Sparingly Right Eye Every Night Patient not taking: Reported on 12/14/2020 12/06/20   [provider]  ondansetron (ZOFRAN-ODT) 4 MG disintegrating tablet Take 1 tablet (4 mg total) by mouth every 8 (eight) hours as needed for nausea or vomiting. 04/24/22   Rancour, Annie Main, MD  prednisoLONE acetate (PRED FORTE) 1 % ophthalmic suspension prednisolone acetate 1 % eye drops,suspension  PLACE 1 DROP INTO LEFT EYE 4 TIMES A DAY Patient not taking: Reported on 12/14/2020    [provider]      Allergies    Patient has no known allergies.    Review of Systems   Review of Systems  HENT:  Positive for sore throat, trouble swallowing and voice change.   All other systems reviewed and are negative.   Physical Exam Updated Vital Signs BP 117/85   Pulse 94   Temp 98.4 F (36.9 C)   Resp 17   Ht '5\' 11"'$  (1.803 m)   Wt 97.5 kg   LMP 01/08/2023 (Exact Date)   SpO2 98%   BMI 29.99 kg/m  Physical Exam Vitals and nursing note reviewed.  Constitutional:      General: She is not in acute  distress.    Appearance: Normal appearance. She is well-developed. She is ill-appearing. She is not toxic-appearing.  HENT:     Head: Normocephalic.     Jaw: No trismus.     Mouth/Throat:     Mouth: Mucous membranes are moist.     Pharynx: Uvula midline. Pharyngeal swelling, oropharyngeal exudate and posterior oropharyngeal erythema present.     Tonsils: Tonsillar exudate present. No tonsillar abscesses. 3+ on the right. 3+ on the left.  Eyes:     General: No scleral icterus. Cardiovascular:     Rate and Rhythm: Normal rate and regular rhythm.  Pulmonary:     Effort: Pulmonary effort is normal.     Breath sounds: Normal breath sounds. No  stridor.  Abdominal:     General: Bowel sounds are normal.     Palpations: Abdomen is soft.  Musculoskeletal:     Cervical back: Normal range of motion.  Lymphadenopathy:     Cervical: Cervical adenopathy present.  Skin:    General: Skin is warm and dry.     Capillary Refill: Capillary refill takes less than 2 seconds.  Neurological:     General: No focal deficit present.     Mental Status: She is alert.  Psychiatric:        Mood and Affect: Mood normal.        Behavior: Behavior normal.     ED Results / Procedures / Treatments   Labs (all labs ordered are listed, but only abnormal results are displayed) Labs Reviewed  CBC WITH DIFFERENTIAL/PLATELET - Abnormal; Notable for the following components:      Result Value   WBC 17.4 (*)    HCT 35.7 (*)    Neutro Abs 13.7 (*)    Monocytes Absolute 1.8 (*)    Abs Immature Granulocytes 0.08 (*)    All other components within normal limits  COMPREHENSIVE METABOLIC PANEL - Abnormal; Notable for the following components:   Sodium 131 (*)    CO2 21 (*)    Calcium 8.5 (*)    AST 13 (*)    All other components within normal limits  LACTIC ACID, PLASMA  HCG, QUANTITATIVE, PREGNANCY  LACTIC ACID, PLASMA    EKG None  Radiology CT Soft Tissue Neck W Contrast  Result Date: 01/22/2023 CLINICAL DATA:  Sore throat EXAM: CT NECK WITH CONTRAST TECHNIQUE: Multidetector CT imaging of the neck was performed using the standard protocol following the bolus administration of intravenous contrast. RADIATION DOSE REDUCTION: This exam was performed according to the departmental dose-optimization program which includes automated exposure control, adjustment of the mA and/or kV according to patient size and/or use of iterative reconstruction technique. CONTRAST:  70m OMNIPAQUE IOHEXOL 300 MG/ML  SOLN COMPARISON:  None Available. FINDINGS: PHARYNX AND LARYNX: The palatine tonsils are enlarged. There is no peritonsillar abscess. No retropharyngeal  abnormality. The epiglottis and larynx are normal. SALIVARY GLANDS: Normal parotid, submandibular and sublingual glands. THYROID: Normal. LYMPH NODES: Reactive right cervical lymph nodes. VASCULAR: Major cervical vessels are patent. LIMITED INTRACRANIAL: Normal. VISUALIZED ORBITS: Normal. MASTOIDS AND VISUALIZED PARANASAL SINUSES: No fluid levels or advanced mucosal thickening. No mastoid effusion. SKELETON: No bony spinal canal stenosis. No lytic or blastic lesions. UPPER CHEST: Clear. OTHER: None. IMPRESSION: 1. Acute tonsillopharyngitis without peritonsillar abscess or fluid collection. 2. Reactive right cervical lymph nodes. Electronically Signed   By: KUlyses JarredM.D.   On: 01/22/2023 21:43    Procedures Procedures    Medications Ordered in ED Medications  lidocaine (  XYLOCAINE) 2 % viscous mouth solution 15 mL (has no administration in time range)  traMADol (ULTRAM) tablet 50 mg (has no administration in time range)  dexamethasone (DECADRON) injection 10 mg (10 mg Intravenous Given 01/22/23 2007)  morphine (PF) 4 MG/ML injection 4 mg (4 mg Intravenous Given 01/22/23 2008)  iohexol (OMNIPAQUE) 300 MG/ML solution 75 mL (75 mLs Intravenous Contrast Given 01/22/23 2058)    ED Course/ Medical Decision Making/ A&P  Medical Decision Making Risk Prescription drug management.  Initial Impression and Ddx 37 year old female who presents with known strep throat on PCN with worsening sore throat, difficulty swallowing, change in voice. Labs and CT soft tissue neck ordered in triage.  Patient PMH that increases complexity of ED encounter:  obesity, GERD Differential: PTA, RPA, ludwig's angina   Interpretation of Diagnostics I independent reviewed and interpreted the labs as followed: wbc 17, lactic negative  - I independently visualized the following imaging with scope of interpretation limited to determining acute life threatening conditions related to emergency care: CT soft tissue neck, which  revealed no PTA  Patient Reassessment and Ultimate Disposition/Management Physical exam consistent with strep throat. 3+ tonsillar swelling bilaterally. Clinically no PTA appearance or sublingual swelling. Hot potato voice. CT soft tissue neck - no PTA, RPA, ludwigs  Has tolerated small amounts of water here.  Given decadron and morphine by triage provider. Minimal relief.  Will given her viscous lidocaine and tramadol.  Should have some improvement over next 24-48 hours with decadron. Already on PCN which is appropriate abx. Will Rx tramadol to help with sleep.  Strict return precautions for worsening symptoms.  The patient has been appropriately medically screened and/or stabilized in the ED. I have low suspicion for any other emergent medical condition which would require further screening, evaluation or treatment in the ED or require inpatient management. At time of discharge the patient is hemodynamically stable and in no acute distress. I have discussed work-up results and diagnosis with patient and answered all questions. Patient is agreeable with discharge plan. We discussed strict return precautions for returning to the emergency department and they verbalized understanding.     Patient management required discussion with the following services or consulting groups:  None  Complexity of Problems Addressed Acute complicated illness or Injury  Additional Data Reviewed and Analyzed Further history obtained from: Further history from spouse/family member and Care Everywhere  Patient Encounter Risk Assessment Prescriptions  Final Clinical Impression(s) / ED Diagnoses Final diagnoses:  Strep throat    Rx / DC Orders ED Discharge Orders          Ordered    traMADol (ULTRAM) 50 MG tablet  Every 6 hours PRN        01/22/23 2317              Mickie Hillier, PA-C 01/22/23 2326    Tegeler, Gwenyth Allegra, MD 01/22/23 [phone]

## 2023-01-30 DIAGNOSIS — H1045 Other chronic allergic conjunctivitis: Secondary | ICD-10-CM | POA: Diagnosis not present

## 2023-01-30 DIAGNOSIS — H11822 Conjunctivochalasis, left eye: Secondary | ICD-10-CM | POA: Diagnosis not present

## 2023-02-01 DIAGNOSIS — H15042 Scleritis with corneal involvement, left eye: Secondary | ICD-10-CM | POA: Diagnosis not present

## 2023-02-01 DIAGNOSIS — H11822 Conjunctivochalasis, left eye: Secondary | ICD-10-CM | POA: Diagnosis not present

## 2023-02-01 DIAGNOSIS — H1045 Other chronic allergic conjunctivitis: Secondary | ICD-10-CM | POA: Diagnosis not present

## 2023-02-06 DIAGNOSIS — H15042 Scleritis with corneal involvement, left eye: Secondary | ICD-10-CM | POA: Diagnosis not present

## 2023-02-06 DIAGNOSIS — H1045 Other chronic allergic conjunctivitis: Secondary | ICD-10-CM | POA: Diagnosis not present

## 2023-02-06 DIAGNOSIS — H11822 Conjunctivochalasis, left eye: Secondary | ICD-10-CM | POA: Diagnosis not present

## 2023-02-09 DIAGNOSIS — H11822 Conjunctivochalasis, left eye: Secondary | ICD-10-CM | POA: Diagnosis not present

## 2023-02-09 DIAGNOSIS — H15042 Scleritis with corneal involvement, left eye: Secondary | ICD-10-CM | POA: Diagnosis not present

## 2023-02-14 ENCOUNTER — Emergency Department (HOSPITAL_COMMUNITY)
Admission: EM | Admit: 2023-02-14 | Discharge: 2023-02-14 | Disposition: A | Discharge status: Home / Self Care | Payer: BC Managed Care – PPO | Attending: Emergency Medicine | Admitting: Emergency Medicine

## 2023-02-14 ENCOUNTER — Emergency Department (HOSPITAL_COMMUNITY): Payer: BC Managed Care – PPO

## 2023-02-14 ENCOUNTER — Other Ambulatory Visit: Payer: Self-pay

## 2023-02-14 DIAGNOSIS — I10 Essential (primary) hypertension: Secondary | ICD-10-CM | POA: Diagnosis not present

## 2023-02-14 DIAGNOSIS — H15042 Scleritis with corneal involvement, left eye: Secondary | ICD-10-CM | POA: Diagnosis not present

## 2023-02-14 DIAGNOSIS — R519 Headache, unspecified: Secondary | ICD-10-CM | POA: Diagnosis not present

## 2023-02-14 DIAGNOSIS — R42 Dizziness and giddiness: Secondary | ICD-10-CM | POA: Diagnosis not present

## 2023-02-14 LAB — BASIC METABOLIC PANEL
Anion gap: 13 (ref 5–15)
BUN: 16 mg/dL (ref 6–20)
CO2: 21 mmol/L — ABNORMAL LOW (ref 22–32)
Calcium: 9.4 mg/dL (ref 8.9–10.3)
Chloride: 102 mmol/L (ref 98–111)
Creatinine, Ser: 0.83 mg/dL (ref 0.44–1.00)
GFR, Estimated: >60 mL/min (ref >60)
Glucose, Bld: 156 mg/dL — ABNORMAL HIGH (ref 70–99)
Potassium: 3.8 mmol/L (ref 3.5–5.1)
Sodium: 136 mmol/L (ref 135–145)

## 2023-02-14 LAB — CBC
HCT: 43 % (ref 36.0–46.0)
Hemoglobin: 13.9 g/dL (ref 12.0–15.0)
MCH: 28.6 pg (ref 26.0–34.0)
MCHC: 32.3 g/dL (ref 30.0–36.0)
MCV: 88.5 fL (ref 80.0–100.0)
Platelets: 318 10*3/uL (ref 150–400)
RBC: 4.86 MIL/uL (ref 3.87–5.11)
RDW: 14.5 % (ref 11.5–15.5)
WBC: 23.4 10*3/uL — ABNORMAL HIGH (ref 4.0–10.5)
nRBC: 0 % (ref 0.0–0.2)

## 2023-02-14 LAB — I-STAT BETA HCG BLOOD, ED (MC, WL, AP ONLY): I-stat hCG, quantitative: <5 m[IU]/mL (ref <5)

## 2023-02-14 LAB — TROPONIN I (HIGH SENSITIVITY): Troponin I (High Sensitivity): <2 ng/L (ref <18)

## 2023-02-14 NOTE — ED Triage Notes (Signed)
Blurred vision to left eye with headache and dizziness that started this am.  Pt reports been on steroids x3 weeks for inflammation to left eye.  Hypertension at home 150/106.  Denies hx HTN.  Denies cp/sob

## 2023-02-14 NOTE — ED Notes (Signed)
Pt left prior to receiving paperwork and d/c teaching. D/c discussed with MD

## 2023-02-14 NOTE — Discharge Instructions (Addendum)
Return for any problem.  ?

## 2023-02-14 NOTE — ED Provider Notes (Signed)
[place] Provider Note   CSN: [id number] Arrival date & time: 02/14/23  1539     History  Chief Complaint  Patient presents with   Hypertension    Megan Pearson is a 37 y.o. female.  37 year old female with prior medical history as detailed below presents for evaluation.  Patient is currently under treatment for scleritis.  She has been on p.o. steroids for 3 weeks.  She reports taking 40 mg of steroids daily.  She has rheumatology appointment this coming Monday for evaluation of possible rheumatologic condition.  Patient reports that earlier today she had mild headache and checked her blood pressure at home.  Her blood pressure was 150/106.  She has not checked her blood pressure prior to today.  She denies associated chest pain.  She denies shortness of breath.  She denies prior history of hypertension.  Patient is visibly tearful and anxious during exam.  The history is provided by the patient and medical records.       Home Medications Prior to Admission medications   Medication Sig Start Date End Date Taking? Authorizing Provider  [facility]) IUD IUD ParaGard T 380A 380 square mm intrauterine device  Take 1 device by intrauterine route.    [provider]  cyclobenzaprine (FLEXERIL) 10 MG tablet Take 0.5-1 tablets (5-10 mg total) by mouth 3 (three) times daily as needed for muscle spasms (sedation caution). 04/22/19   Tonia Ghent, MD  dexamethasone (DECADRON) 0.1 % ophthalmic solution dexamethasone sodium phosphate 0.1 % eye drops  INSTILL 1 DROP IN RIGHT EYE FOUR TIMES DAILY Patient not taking: Reported on 12/14/2020    [provider]  diazepam (VALIUM) 2 MG tablet Take 1 tablet (2 mg total) by mouth every 8 (eight) hours as needed for anxiety. 04/24/22   Rancour, Annie Main, MD  Difluprednate 0.05 % EMUL INSTILL 1 DROP IN RIGHT EYE FOUR TIMES DAILY 12/10/20   [provider]  ibuprofen  (ADVIL) 200 MG tablet Take 200 mg by mouth every 6 (six) hours as needed.    [provider]  meclizine (ANTIVERT) 25 MG tablet Take 1 tablet (25 mg total) by mouth 3 (three) times daily as needed for dizziness. 04/24/22   Rancour, Annie Main, MD  neomycin-polymyxin b-dexamethasone (MAXITROL) 3.5-10000-0.1 OINT SMARTSIG:1 Sparingly Right Eye Every Night Patient not taking: Reported on 12/14/2020 12/06/20   [provider]  ondansetron (ZOFRAN-ODT) 4 MG disintegrating tablet Take 1 tablet (4 mg total) by mouth every 8 (eight) hours as needed for nausea or vomiting. 04/24/22   Rancour, Annie Main, MD  prednisoLONE acetate (PRED FORTE) 1 % ophthalmic suspension prednisolone acetate 1 % eye drops,suspension  PLACE 1 DROP INTO LEFT EYE 4 TIMES A DAY Patient not taking: Reported on 12/14/2020    [provider]  traMADol (ULTRAM) 50 MG tablet Take 1 tablet (50 mg total) by mouth every 6 (six) hours as needed. 01/22/23   Mickie Hillier, PA-C      Allergies    Patient has no known allergies.    Review of Systems   Review of Systems  All other systems reviewed and are negative.   Physical Exam Updated Vital Signs BP (!) 130/92   Pulse 82   Temp 98.5 F (36.9 C) (Oral)   Resp 16   LMP 01/08/2023 (Exact Date)   SpO2 98%  Physical Exam Vitals and nursing note reviewed.  Constitutional:      General: She is not in acute  distress.    Appearance: Normal appearance. She is well-developed.  HENT:     Head: Normocephalic and atraumatic.  Eyes:     Pupils: Pupils are equal, round, and reactive to light.     Comments: Injected sclera and conjunctiva of the left eye.  Consistent with reported history of scleritis.  Cardiovascular:     Rate and Rhythm: Normal rate and regular rhythm.     Heart sounds: Normal heart sounds.  Pulmonary:     Effort: Pulmonary effort is normal. No respiratory distress.     Breath sounds: Normal breath sounds.  Abdominal:     General: There is no  distension.     Palpations: Abdomen is soft.     Tenderness: There is no abdominal tenderness.  Musculoskeletal:        General: No deformity. Normal range of motion.     Cervical back: Normal range of motion and neck supple.  Skin:    General: Skin is warm and dry.  Neurological:     General: No focal deficit present.     Mental Status: She is alert and oriented to person, place, and time. Mental status is at baseline.     Cranial Nerves: No cranial nerve deficit.     ED Results / Procedures / Treatments   Labs (all labs ordered are listed, but only abnormal results are displayed) Labs Reviewed  CBC - Abnormal; Notable for the following components:      Result Value   WBC 23.4 (*)    All other components within normal limits  BASIC METABOLIC PANEL - Abnormal; Notable for the following components:   CO2 21 (*)    Glucose, Bld 156 (*)    All other components within normal limits  I-STAT BETA HCG BLOOD, ED ([facility], [facility], [facility] ONLY)  TROPONIN I (HIGH SENSITIVITY)  TROPONIN I (HIGH SENSITIVITY)    EKG EKG Interpretation  Date/Time:  Wednesday February 14 2023 15:59:29 EST Ventricular Rate:  126 PR Interval:  134 QRS Duration: 87 QT Interval:  292 QTC Calculation: 423 R Axis:   85 Text Interpretation: Sinus tachycardia LAE, consider biatrial enlargement Anterolateral infarct, age indeterminate Confirmed by Dene Gentry [id number]) on 02/14/2023 6:03:19 PM  Radiology CT Head Wo Contrast  Result Date: 02/14/2023 CLINICAL DATA:  Headache, dizziness, and blurred vision in the left eye. EXAM: CT HEAD WITHOUT CONTRAST TECHNIQUE: Contiguous axial images were obtained from the base of the skull through the vertex without intravenous contrast. RADIATION DOSE REDUCTION: This exam was performed according to the departmental dose-optimization program which includes automated exposure control, adjustment of the mA and/or kV according to patient size and/or use of iterative reconstruction technique.  COMPARISON:  Head and neck CTA 04/24/2022 FINDINGS: Brain: There is no evidence of an acute infarct, intracranial hemorrhage, mass, midline shift, or extra-axial fluid collection. The ventricles and sulci are normal. Vascular: No hyperdense vessel. Skull: No fracture or suspicious osseous lesion. Sinuses/Orbits: Partially visualized chronic small mucous retention cyst in the right maxillary sinus. Clear mastoid air cells. Unremarkable orbits. Other: None. IMPRESSION: Negative head CT. Electronically Signed   By: Logan Bores M.D.   On: 02/14/2023 17:04    Procedures Procedures    Medications Ordered in ED Medications - No data to display  ED Course/ Medical Decision Making/ A&P                             Medical Decision Making  Medical Screen Complete  This patient presented to the ED with complaint of elevated blood pressure.  This complaint involves an extensive number of treatment options. The initial differential diagnosis includes, but is not limited to, hypertension, metabolic abnormality, etc.  This presentation is: Acute, Self-Limited, Previously Undiagnosed, Uncertain Prognosis, Complicated, Systemic Symptoms, and Threat to Life/Bodily Function  Patient presents with complaint of elevated blood pressure x 1 earlier today.  Patient is currently on a lengthy course of oral steroids.  Patient without prior history of hypertension.  Patient is visibly anxious regarding her current medical conditions.  Screening labs obtained in the ED are without significant abnormality.  Patient's white count is elevated related to her current use of oral steroids.  Patient without indication for initiating antihypertensive medications at this time.  Patient without evidence of endorgan damage related to her mildly elevated blood pressure.  Multiple repeated blood pressures here in the ED are essentially normal with mild elevations of diastolic pressures.  Patient reassured by ED  evaluation.  Patient understands need for close outpatient follow-up.  Strict return precautions given and understood.  Additional history obtained:  External records from outside sources obtained and reviewed including prior ED visits and prior Inpatient records.    Lab Tests:  I ordered and personally interpreted labs.  The pertinent results include: CBC, BMP, troponin    Imaging Studies ordered:  I ordered imaging studies including CT head I independently visualized and interpreted obtained imaging which showed NAD I agree with the radiologist interpretation.   Cardiac Monitoring:  The patient was maintained on a cardiac monitor.  I personally viewed and interpreted the cardiac monitor which showed an underlying rhythm of: NSR  Problem List / ED Course:  Hypertension   Reevaluation:  After the interventions noted above, I reevaluated the patient and found that they have: improved   Disposition:  After consideration of the diagnostic results and the patients response to treatment, I feel that the patent would benefit from close outpatient follow-up.          Final Clinical Impression(s) / ED Diagnoses Final diagnoses:  Hypertension, unspecified type    Rx / DC Orders ED Discharge Orders     None         Valarie Merino, MD 02/14/23 1907

## 2023-02-14 NOTE — ED Provider Triage Note (Signed)
Emergency Medicine Provider Triage Evaluation Note  Megan Pearson , a 37 y.o. female  was evaluated in triage.  Pt complains of blurry vision to the left eye with associated headache and dizziness that started this morning.  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  States everything looks "hazy", and everything looks like "being submerged in the water".  Endorses shortness of breath.  Denies chest pain, fever, extremity weakness or numbness.  Review of Systems  Positive: As above Negative: As above  Physical Exam  BP (!) 136/101   Pulse 90   Temp 98.5 F (36.9 C) (Oral)   Resp 14   LMP 01/08/2023 (Exact Date)   SpO2 96%  Gen:   Awake, no distress   Resp:  Normal effort  MSK:   Moves extremities without difficulty  Other:    Medical Decision Making  Medically screening exam initiated at 4:26 PM.  Appropriate orders placed.  Megan Pearson was informed that the remainder of the evaluation will be completed by another provider, this initial triage assessment does not replace that evaluation, and the importance of remaining in the ED until their evaluation is complete.    Rex Kras, [place] 02/15/23 [phone]

## 2023-02-15 DIAGNOSIS — H15042 Scleritis with corneal involvement, left eye: Secondary | ICD-10-CM | POA: Diagnosis not present

## 2023-02-16 DIAGNOSIS — R768 Other specified abnormal immunological findings in serum: Secondary | ICD-10-CM | POA: Diagnosis not present

## 2023-02-16 DIAGNOSIS — R591 Generalized enlarged lymph nodes: Secondary | ICD-10-CM | POA: Diagnosis not present

## 2023-02-16 DIAGNOSIS — R5381 Other malaise: Secondary | ICD-10-CM | POA: Diagnosis not present

## 2023-02-16 DIAGNOSIS — H5789 Other specified disorders of eye and adnexa: Secondary | ICD-10-CM | POA: Diagnosis not present

## 2023-02-19 ENCOUNTER — Encounter: Payer: BC Managed Care – PPO | Admitting: Internal Medicine

## 2023-02-19 DIAGNOSIS — R768 Other specified abnormal immunological findings in serum: Secondary | ICD-10-CM | POA: Diagnosis not present

## 2023-02-19 DIAGNOSIS — Z8669 Personal history of other diseases of the nervous system and sense organs: Secondary | ICD-10-CM | POA: Diagnosis not present

## 2023-02-19 DIAGNOSIS — Z79899 Other long term (current) drug therapy: Secondary | ICD-10-CM | POA: Diagnosis not present

## 2023-02-21 DIAGNOSIS — H15042 Scleritis with corneal involvement, left eye: Secondary | ICD-10-CM | POA: Diagnosis not present

## 2023-02-24 ENCOUNTER — Encounter: Payer: Self-pay | Admitting: Family Medicine

## 2023-02-28 NOTE — Progress Notes (Unsigned)
HPI: [name] is a 37 y.o. female, who is here today to establish care.  Former PCP: *** Last preventive routine visit: ***  Chronic medical problems: ***  Evaluated in the ED on 02/14/2023 for hypertension, BP 150/106. At the time of the visit she was having mild headache, dizziness, and blurry vision in the left eye. Head CT was negative.  She has been treated by eye care provider for scleritis with oral steroids.  Evaluated by rheumatologist on***  Hypertension:  Medications:*** BP readings at home:*** Side effects:***  Negative for unusual or severe headache, visual changes, exertional chest pain, dyspnea,  focal weakness, or edema.  Lab Results  Component Value Date   CREATININE 0.83 02/14/2023   BUN 16 02/14/2023   NA 136 02/14/2023   K 3.8 02/14/2023   CL 102 02/14/2023   CO2 21 (L) 02/14/2023  Ca++ 8.5 (alb 4.0). *** Glucose elevated at 156. No hx of diabetes. Lab Results  Component Value Date   WBC 23.4 (H) 02/14/2023   HGB 13.9 02/14/2023   HCT 43.0 02/14/2023   MCV 88.5 02/14/2023   PLT 318 02/14/2023   Review of Systems See other pertinent positives and negatives in HPI.  Current Outpatient Medications on File Prior to Visit  Medication Sig Dispense Refill   acetaminophen (TYLENOL) 325 MG tablet Take 650 mg by mouth every 6 (six) hours as needed.     ibuprofen (ADVIL) 200 MG tablet Take 200 mg by mouth every 6 (six) hours as needed.     predniSONE (DELTASONE) 20 MG tablet Take 30 mg by mouth daily with breakfast.     No current facility-administered medications on file prior to visit.   Past Medical History:  Diagnosis Date   Anxiety    Ectopic pregnancy    No Known Allergies  Family History  Problem Relation Age of Onset   Breast cancer Mother    Hypertension Mother    Cancer Mother    Macular degeneration Mother    GER disease Mother    Colon cancer Father    Cancer Father    Hypertension Brother    Obesity Brother     Hypertension Maternal Grandmother    Cancer Maternal Grandfather    Skin cancer Maternal Grandfather    Alcohol abuse Neg Hx    Arthritis Neg Hx    Asthma Neg Hx    Birth defects Neg Hx    COPD Neg Hx    Depression Neg Hx    Diabetes Neg Hx    Drug abuse Neg Hx    Early death Neg Hx    Hearing loss Neg Hx    Heart disease Neg Hx    Hyperlipidemia Neg Hx    Kidney disease Neg Hx    Learning disabilities Neg Hx    Mental illness Neg Hx    Mental retardation Neg Hx    Miscarriages / Stillbirths Neg Hx    Stroke Neg Hx    Vision loss Neg Hx    Varicose Veins Neg Hx     Social History   Socioeconomic History   Marital status: Married    Spouse name: Not on file   Number of children: Not on file   Years of education: Not on file   Highest education level: Not on file  Occupational History   Not on file  Tobacco Use   Smoking status: Former    Packs/day: 0.50    Years: 10.00    Total  pack years: 5.00    Types: Cigarettes   Smokeless tobacco: Never  Vaping Use   Vaping Use: Never used  Substance and Sexual Activity   Alcohol use: Yes    Comment: rare   Drug use: No   Sexual activity: Yes    Birth control/protection: None  Other Topics Concern   Not on file  Social History Narrative   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     Social Determinants of Health   Financial Resource Strain: Not on file  Food Insecurity: Not on file  Transportation Needs: Not on file  Physical Activity: Not on file  Stress: Not on file  Social Connections: Not on file    Vitals:   03/02/23 0759  BP: 120/80  Pulse: (!) 110  Resp: 12  Temp: 97.8 F (36.6 C)  SpO2: 99%    Body mass index is 29.74 kg/m.  Physical Exam Vitals and nursing note reviewed.  Constitutional:      General: She is not in acute distress.    Appearance: She is well-developed.  HENT:     Head: Normocephalic and atraumatic.      Mouth/Throat:     Mouth: Mucous membranes are moist.     Pharynx: Oropharynx is clear.  Eyes:     Conjunctiva/sclera: Conjunctivae normal.     Pupils: Pupils are equal.     Right eye: Pupil is round and reactive.     Left eye: Pupil is sluggish. Pupil is round and reactive.  Cardiovascular:     Rate and Rhythm: Normal rate and regular rhythm.     Pulses:          Dorsalis pedis pulses are 2+ on the right side and 2+ on the left side.     Heart sounds: No murmur heard. Pulmonary:     Effort: Pulmonary effort is normal. No respiratory distress.     Breath sounds: Normal breath sounds.  Abdominal:     Palpations: Abdomen is soft. There is no hepatomegaly or mass.     Tenderness: There is no abdominal tenderness.  Lymphadenopathy:     Cervical: No cervical adenopathy.  Skin:    General: Skin is warm.     Findings: No erythema or rash.  Neurological:     General: No focal deficit present.     Mental Status: She is alert and oriented to person, place, and time.     Cranial Nerves: No cranial nerve deficit.     Gait: Gait normal.  Psychiatric:     Comments: Well groomed, good eye contact.     ASSESSMENT AND PLAN: Megan Pearson was seen today for establish care.  Diagnoses and all orders for this visit:  Leukocytosis, unspecified type  Elevated glucose level -     POC HgB A1c  Elevated blood pressure reading  Scleritis of left eye    Leukocytosis, unspecified type  Elevated glucose level -     POCT glycosylated hemoglobin (Hb A1C)  Elevated blood pressure reading  Scleritis of left eye    Return in about 1 year (around 03/01/2024) for CPE.  Sebastian Dzik G. [place], MD  [facility]. [place] office.

## 2023-03-01 DIAGNOSIS — H15042 Scleritis with corneal involvement, left eye: Secondary | ICD-10-CM | POA: Diagnosis not present

## 2023-03-02 ENCOUNTER — Encounter: Payer: Self-pay | Admitting: Family Medicine

## 2023-03-02 ENCOUNTER — Ambulatory Visit: Payer: BC Managed Care – PPO | Admitting: Family Medicine

## 2023-03-02 VITALS — BP 120/80 | HR 110 | Temp 97.8°F | Resp 12 | Ht 71.0 in | Wt 213.2 lb

## 2023-03-02 DIAGNOSIS — H15002 Unspecified scleritis, left eye: Secondary | ICD-10-CM | POA: Diagnosis not present

## 2023-03-02 DIAGNOSIS — D72829 Elevated white blood cell count, unspecified: Secondary | ICD-10-CM | POA: Diagnosis not present

## 2023-03-02 DIAGNOSIS — R030 Elevated blood-pressure reading, without diagnosis of hypertension: Secondary | ICD-10-CM | POA: Diagnosis not present

## 2023-03-02 DIAGNOSIS — R7309 Other abnormal glucose: Secondary | ICD-10-CM | POA: Diagnosis not present

## 2023-03-02 LAB — POCT GLYCOSYLATED HEMOGLOBIN (HGB A1C): Hemoglobin A1C: 5.4 % (ref 4.0–5.6)

## 2023-03-02 NOTE — Patient Instructions (Addendum)
It was nice to meet you today!  A few things to remember from today's visit:  Leukocytosis, unspecified type  Elevated glucose level - Plan: POC HgB A1c  Elevated blood pressure reading  Scleritis of left eye  Continue following with eye care provider and rheumatologist. I can see you annually, before if needed.  Do not use My Chart to request refills or for acute issues that need immediate attention. If you send a my chart message, it may take a few days to be addressed, specially if I am not in the office.  Please be sure medication list is accurate. If a new problem present, please set up appointment sooner than planned today.

## 2023-03-03 NOTE — Assessment & Plan Note (Signed)
Gradually improving. Currently on Prednisone 30 mg daily and the plan is to continue tapering dose off as tolerated. Following with ophthalmologist and rheumatologist.

## 2023-03-03 NOTE — Assessment & Plan Note (Signed)
Still some DBP's mildly elevated but in general BP readings have improved since prednisone has been tared down. Continue monitoring BP regularly. We discussed some side effects of prednisone.

## 2023-03-07 DIAGNOSIS — H15123 Nodular episcleritis, bilateral: Secondary | ICD-10-CM | POA: Diagnosis not present

## 2023-03-08 DIAGNOSIS — H209 Unspecified iridocyclitis: Secondary | ICD-10-CM | POA: Diagnosis not present

## 2023-03-08 DIAGNOSIS — H5789 Other specified disorders of eye and adnexa: Secondary | ICD-10-CM | POA: Diagnosis not present

## 2023-03-08 DIAGNOSIS — R768 Other specified abnormal immunological findings in serum: Secondary | ICD-10-CM | POA: Diagnosis not present

## 2023-03-08 DIAGNOSIS — Z8669 Personal history of other diseases of the nervous system and sense organs: Secondary | ICD-10-CM | POA: Diagnosis not present

## 2023-03-14 NOTE — Progress Notes (Unsigned)
ACUTE VISIT Chief Complaint  Patient presents with   Medication Problem    Discuss some options to help with side effects from steroids.    HPI: [name] is a 37 y.o. female, who is here today with above concerns. She was seen on 03/02/23, since then she has followed with rheumatologist. She has been Dx'ed with left eye scleritis and currently on Prednisone. Since her last visit Prednisone dose was increased again from 30 mg to 35 mg daily because symptoms worsening when trying to decrease dose. Insomnia, tachycardia, anxiety, and palpitations since dose was increased.   She has been experiencing palpitations multiple times daily, lasting approximately 30 seconds to a minute each. These episodes are not consistently triggered by physical activity. During these episodes, her heart rate has been observed to reach up to 130 bpm, as monitored by their Apple watch. Despite the absence of chest pain or difficulty breathing during these episodes,  she experiences a sense of panic afterward. Additionally, the patient describes a sensation of their heart "skipping a beat" during periods of increased heart rate, though this is not corroborated by irregular rhythm readings on their watch. Lab Results  Component Value Date   CREATININE 0.83 02/14/2023   BUN 16 02/14/2023   NA 136 02/14/2023   K 3.8 02/14/2023   CL 102 02/14/2023   CO2 21 (L) 02/14/2023   Lab Results  Component Value Date   WBC 23.4 (H) 02/14/2023   HGB 13.9 02/14/2023   HCT 43.0 02/14/2023   MCV 88.5 02/14/2023   PLT 318 02/14/2023   Lab Results  Component Value Date   ALT 13 01/22/2023   AST 13 (L) 01/22/2023   ALKPHOS 62 01/22/2023   BILITOT 0.6 01/22/2023   She reports suffering from insomnia, managing only four to five hours of sleep nightly. Problem has gradually worse since taking Prednisone dose above 20 mg daily.  Review of Systems  Constitutional:  Positive for fatigue. Negative for chills and fever.   Respiratory:  Negative for cough, shortness of breath and wheezing.   Cardiovascular:  Negative for leg swelling.  Gastrointestinal:  Positive for constipation. Negative for abdominal pain, nausea and vomiting.  Endocrine: Negative for cold intolerance and heat intolerance.  Genitourinary:  Negative for decreased urine volume, dysuria and hematuria.  Musculoskeletal:  Negative for gait problem and myalgias.  Neurological:  Negative for syncope and headaches.  Psychiatric/Behavioral:  Positive for sleep disturbance. Negative for confusion and hallucinations. The patient is nervous/anxious.   See other pertinent positives and negatives in HPI.  Current Outpatient Medications on File Prior to Visit  Medication Sig Dispense Refill   acetaminophen (TYLENOL) 325 MG tablet Take 650 mg by mouth every 6 (six) hours as needed.     azaTHIOprine (IMURAN) 50 MG tablet Take 50 mg by mouth daily.     ibuprofen (ADVIL) 200 MG tablet Take 200 mg by mouth every 6 (six) hours as needed.     predniSONE (DELTASONE) 20 MG tablet Take 35 mg by mouth daily with breakfast.     No current facility-administered medications on file prior to visit.    Past Medical History:  Diagnosis Date   Anxiety    Ectopic pregnancy    No Known Allergies  Social History   Socioeconomic History   Marital status: Married    Spouse name: Not on file   Number of children: Not on file   Years of education: Not on file   Highest education level: Not  on file  Occupational History   Not on file  Tobacco Use   Smoking status: Former    Packs/day: 0.50    Years: 10.00    Additional pack years: 0.00    Total pack years: 5.00    Types: Cigarettes   Smokeless tobacco: Never  Vaping Use   Vaping Use: Never used  Substance and Sexual Activity   Alcohol use: Yes    Comment: rare   Drug use: No   Sexual activity: Yes    Birth control/protection: None  Other Topics Concern   Not on file  Social History Narrative    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     Social Determinants of Health   Financial Resource Strain: Not on file  Food Insecurity: Not on file  Transportation Needs: Not on file  Physical Activity: Not on file  Stress: Not on file  Social Connections: Not on file   Vitals:   03/16/23 1155  BP: 128/80  Pulse: 100  Resp: 12  Temp: 98.6 F (37 C)  SpO2: 98%   Body mass index is 30.54 kg/m.  Physical Exam Vitals and nursing note reviewed.  Constitutional:      General: She is not in acute distress.    Appearance: She is well-developed.  HENT:     Head: Normocephalic and atraumatic.     Mouth/Throat:     Mouth: Mucous membranes are dry.     Pharynx: Oropharynx is clear.  Eyes:     Conjunctiva/sclera: Conjunctivae normal.  Cardiovascular:     Rate and Rhythm: Normal rate and regular rhythm.     Pulses:          Dorsalis pedis pulses are 2+ on the right side and 2+ on the left side.     Heart sounds: No murmur heard. Pulmonary:     Effort: Pulmonary effort is normal. No respiratory distress.     Breath sounds: Normal breath sounds.  Abdominal:     Palpations: Abdomen is soft. There is no hepatomegaly or mass.     Tenderness: There is no abdominal tenderness.  Musculoskeletal:     Right lower leg: No edema.     Left lower leg: No edema.  Skin:    General: Skin is warm.     Findings: No erythema or rash.  Neurological:     General: No focal deficit present.     Mental Status: She is alert and oriented to person, place, and time.     Cranial Nerves: No cranial nerve deficit.     Gait: Gait normal.  Psychiatric:        Mood and Affect: Affect normal. Mood is anxious.   ASSESSMENT AND PLAN:  Ms. Megan Pearson was seen today for medication problem.  Diagnoses and all orders for this visit: Lab Results  Component Value Date   TSH 3.44 12/14/2020   Drug-induced insomnia (HCC) Assessment &  Plan: We discussed some side effects of Prednisone. Good sleep hygiene is important. Pharmacologic treatment options discussed, she agrees with trying Trazodone, starting with 1/2 tab and increasing to 50 mg if needed. Some side effects discussed. F/U in 7 weeks.  Orders: -     traZODone HCl; Take 1 tablet (50 mg total) by mouth at bedtime.  Dispense: 30 tablet; Refill: 1  Sinus tachycardia Assessment & Plan: Hx and examination today do not suggest a serious process, she  agrees to hold on cardiology referral for now. EKG's done during ED evaluations 01/2023 sinus tach and 12/2022 early repolarization, ? IVCD. Metoprolol Tartrate 25 mg bid, start with 1/2 tab bid and increase to 1 tab bid in 2 weeks if well tolerated.  Side effects discussed. Continue monitoring HR and if possible BP. Instructed about warning signs. F/U in 6-7 weeks.  Orders: -     Metoprolol Tartrate; Take 1 tablet (25 mg total) by mouth 2 (two) times daily.  Dispense: 60 tablet; Refill: 3 -     TSH; Future  Anxiety disorder, unspecified type Assessment & Plan: Problem has been aggravated by Prednisone and recent health issues. Today Trazodone 50 mg added to also help with sleep and Metoprolol to help with palpitations, these medications may also help with anxiety. CBT is also something to consider. F/U in 7 weeks, before if needed.   Return in about 7 weeks (around 05/04/2023).  Janny Crute G. [place], MD  [facility]. [place] office.

## 2023-03-16 ENCOUNTER — Encounter: Payer: Self-pay | Admitting: Family Medicine

## 2023-03-16 ENCOUNTER — Ambulatory Visit: Payer: BC Managed Care – PPO | Admitting: Family Medicine

## 2023-03-16 VITALS — BP 128/80 | HR 100 | Temp 98.6°F | Resp 12 | Ht 71.0 in | Wt 219.0 lb

## 2023-03-16 DIAGNOSIS — F19982 Other psychoactive substance use, unspecified with psychoactive substance-induced sleep disorder: Secondary | ICD-10-CM | POA: Insufficient documentation

## 2023-03-16 DIAGNOSIS — R000 Tachycardia, unspecified: Secondary | ICD-10-CM | POA: Diagnosis not present

## 2023-03-16 DIAGNOSIS — F419 Anxiety disorder, unspecified: Secondary | ICD-10-CM | POA: Diagnosis not present

## 2023-03-16 MED ORDER — TRAZODONE HCL 50 MG PO TABS: 50.0000 mg | ORAL_TABLET | Freq: Every day | ORAL | 1 refills | Status: DC

## 2023-03-16 MED ORDER — METOPROLOL TARTRATE 25 MG PO TABS: 25.0000 mg | ORAL_TABLET | Freq: Two times a day (BID) | ORAL | 3 refills | Status: DC

## 2023-03-16 NOTE — Patient Instructions (Addendum)
A few things to remember from today's visit:  Sinus tachycardia - Plan: metoprolol tartrate (LOPRESSOR) 25 MG tablet, TSH  Drug-induced insomnia (HCC) - Plan: traZODone (DESYREL) 50 MG tablet  Metoprolol tartrate to start 1/2 tab bid and increase to whole tab in 2 weeks. Trazodone 1/2 tab at bedtime to help with sleep. Increase to whole tab in a week if needed.  If you need refills for medications you take chronically, please call your pharmacy. Do not use My Chart to request refills or for acute issues that need immediate attention. If you send a my chart message, it may take a few days to be addressed, specially if I am not in the office.  Please be sure medication list is accurate. If a new problem present, please set up appointment sooner than planned today.

## 2023-03-18 NOTE — Assessment & Plan Note (Signed)
Problem has been aggravated by Prednisone and recent health issues. Today Trazodone 50 mg added to also help with sleep and Metoprolol to help with palpitations, these medications may also help with anxiety. CBT is also something to consider. F/U in 7 weeks, before if needed.

## 2023-03-18 NOTE — Assessment & Plan Note (Signed)
Hx and examination today do not suggest a serious process, she agrees to hold on cardiology referral for now. EKG's done during ED evaluations 01/2023 sinus tach and 12/2022 early repolarization, ? IVCD. Metoprolol Tartrate 25 mg bid, start with 1/2 tab bid and increase to 1 tab bid in 2 weeks if well tolerated.  Side effects discussed. Continue monitoring HR and if possible BP. Instructed about warning signs. F/U in 6-7 weeks.

## 2023-03-18 NOTE — Assessment & Plan Note (Signed)
We discussed some side effects of Prednisone. Good sleep hygiene is important. Pharmacologic treatment options discussed, she agrees with trying Trazodone, starting with 1/2 tab and increasing to 50 mg if needed. Some side effects discussed. F/U in 7 weeks.

## 2023-03-27 DIAGNOSIS — H15013 Anterior scleritis, bilateral: Secondary | ICD-10-CM | POA: Diagnosis not present

## 2023-04-02 NOTE — Progress Notes (Signed)
ACUTE VISIT Chief Complaint  Patient presents with   Medication Management    Following up on metoprolol. Still having some anxiety symptoms & heart palpitations.    HPI: [name] is a 37 y.o. female, who is here today complaining of persistent palpitations and anxiety. She was seen on 03/16/23, when a few medications were started to treat some of her symptoms, exacerbated by use of Prednisone to treat anterior scleritis. Since her last visit, Prednisone dose has been decreased from 35 mg to 30 mg. She has ceased taking azathioprine, transitioning to Humira instead.  She is scheduled for regular visits with her eye care provider every six weeks and anticipates an appointment with the rheumatologist in approximately one month, pending insurance approval for Humira.  Anxiety and insomnia: She is taking Trazodone 50 mg 1/2 tab at bedtime, which it is helping,sleeping about six hours. Anxiety levels remain elevated, adversely affecting work and focus. She has tried Lexapro for anxiety, which was beneficial.  Metoprolol 25 mg started to help with palpitations, she is taking 1/2 tab bid. She still experiences heart palpitations, particularly at night, which are described as irregular. There is no associated chest pain or diaphoresis but she sometimes feels "breathless." Negative for orthopnea, PND,DOE, or edema.  She uses an Scientist, physiological, which records heart rates ranging from 51 to 155 beats per minute, with an average of 91. Despite these fluctuations, the watch does not indicate irregular heart rates.   Review of Systems  Constitutional:  Positive for fatigue. Negative for chills and fever.  HENT:  Negative for sore throat and trouble swallowing.   Respiratory:  Negative for cough and wheezing.   Gastrointestinal:  Negative for abdominal pain, nausea and vomiting.  Endocrine: Negative for cold intolerance and heat intolerance.  Genitourinary:  Negative for decreased urine volume,  dysuria and hematuria.  Neurological:  Negative for syncope, facial asymmetry and weakness.  Psychiatric/Behavioral:  Negative for confusion and hallucinations. The patient is nervous/anxious.   See other pertinent positives and negatives in HPI.  Current Outpatient Medications on File Prior to Visit  Medication Sig Dispense Refill   acetaminophen (TYLENOL) 325 MG tablet Take 650 mg by mouth every 6 (six) hours as needed.     metoprolol tartrate (LOPRESSOR) 25 MG tablet Take 1 tablet (25 mg total) by mouth 2 (two) times daily. 60 tablet 3   predniSONE (DELTASONE) 20 MG tablet Take 35 mg by mouth daily with breakfast.     No current facility-administered medications on file prior to visit.   Past Medical History:  Diagnosis Date   Anxiety    Ectopic pregnancy    No Known Allergies  Social History   Socioeconomic History   Marital status: Married    Spouse name: Not on file   Number of children: Not on file   Years of education: Not on file   Highest education level: Not on file  Occupational History   Not on file  Tobacco Use   Smoking status: Former    Packs/day: 0.50    Years: 10.00    Additional pack years: 0.00    Total pack years: 5.00    Types: Cigarettes   Smokeless tobacco: Never  Vaping Use   Vaping Use: Never used  Substance and Sexual Activity   Alcohol use: Yes    Comment: rare   Drug use: No   Sexual activity: Yes    Birth control/protection: None  Other Topics Concern   Not on file  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     Social Determinants of Health   Financial Resource Strain: Not on file  Food Insecurity: Not on file  Transportation Needs: Not on file  Physical Activity: Not on file  Stress: Not on file  Social Connections: Not on file   Vitals:   04/03/23 0754  BP: 128/70  Pulse: (!) 105  Resp: 16  Temp: 98.1 F (36.7 C)  SpO2:  97%   Body mass index is 31 kg/m.  Physical Exam Vitals and nursing note reviewed.  Constitutional:      General: She is not in acute distress.    Appearance: She is well-developed.  HENT:     Head: Normocephalic and atraumatic.  Eyes:     Conjunctiva/sclera: Conjunctivae normal.  Cardiovascular:     Rate and Rhythm: Regular rhythm. Tachycardia present.     Pulses:          Dorsalis pedis pulses are 2+ on the right side and 2+ on the left side.     Heart sounds: No murmur heard. Pulmonary:     Effort: Pulmonary effort is normal. No respiratory distress.     Breath sounds: Normal breath sounds.  Abdominal:     Palpations: Abdomen is soft. There is no hepatomegaly or mass.     Tenderness: There is no abdominal tenderness.  Musculoskeletal:     Right lower leg: No edema.     Left lower leg: No edema.  Skin:    General: Skin is warm.     Findings: No erythema or rash.  Neurological:     General: No focal deficit present.     Mental Status: She is alert and oriented to person, place, and time.     Cranial Nerves: No cranial nerve deficit.     Gait: Gait normal.  Psychiatric:        Mood and Affect: Mood is anxious. Affect is tearful.   ASSESSMENT AND PLAN:  Ms. Woode was seen today for anxiety and palpitations.  Drug-induced insomnia Assessment & Plan: Problem has improved. Continue Trazodone 50 mg 1/2 tab daily at bedtime. Good sleep hygiene to continue.  Orders: -     traZODone HCl; Take 0.5 tablets (25 mg total) by mouth at bedtime.  Dispense: 30 tablet; Refill: 1  Sinus tachycardia Assessment & Plan: Problem is not well controlled. EKG's done during ED evaluations 01/2023 sinus tach and 12/2022 early repolarization, ? IVCD. Metoprolol Tartrate increased from 12.5 mg bid to 25 mg bid.  Side effects discussed. Continue monitoring HR. She prefers to continue holding on cardiologist referral. Instructed about warning signs. Keep next appt in 04/2023.   Anxiety  disorder, unspecified type Assessment & Plan: Problem has not improved at all after starting Trazodone. She tried Lexapro 10 mg added today, it helped before for anxiety. Hydroxyzine 25 mg 0.5-1 tab tid prn for acute anxiety.  We discussed some side effects of meds and the risk of med interaction.  CBT recommended. Keep next appt.  Orders: -     Escitalopram Oxalate; Take 1 tablet (10 mg total) by mouth daily.  Dispense: 30 tablet; Refill: 1 -     hydrOXYzine Pamoate; Take 1 capsule (25 mg total) by mouth every 8 (eight) hours as needed for anxiety.  Dispense: 30 capsule; Refill: 0  Return for keep next appointment.  Jaine Estabrooks G. [place],  MD  [facility]. [place] office.

## 2023-04-03 ENCOUNTER — Encounter: Payer: Self-pay | Admitting: Family Medicine

## 2023-04-03 ENCOUNTER — Ambulatory Visit: Payer: BC Managed Care – PPO | Admitting: Family Medicine

## 2023-04-03 VITALS — BP 128/70 | HR 105 | Temp 98.1°F | Resp 16 | Ht 71.0 in | Wt 222.2 lb

## 2023-04-03 DIAGNOSIS — F19982 Other psychoactive substance use, unspecified with psychoactive substance-induced sleep disorder: Secondary | ICD-10-CM | POA: Diagnosis not present

## 2023-04-03 DIAGNOSIS — R000 Tachycardia, unspecified: Secondary | ICD-10-CM

## 2023-04-03 DIAGNOSIS — F419 Anxiety disorder, unspecified: Secondary | ICD-10-CM

## 2023-04-03 MED ORDER — HYDROXYZINE PAMOATE 25 MG PO CAPS: 25.0000 mg | ORAL_CAPSULE | Freq: Three times a day (TID) | ORAL | 0 refills | Status: AC | PRN

## 2023-04-03 MED ORDER — TRAZODONE HCL 50 MG PO TABS: 25.0000 mg | ORAL_TABLET | Freq: Every day | ORAL | 1 refills | Status: DC

## 2023-04-03 MED ORDER — ESCITALOPRAM OXALATE 10 MG PO TABS: 10.0000 mg | ORAL_TABLET | Freq: Every day | ORAL | 1 refills | Status: DC

## 2023-04-03 NOTE — Patient Instructions (Signed)
A few things to remember from today's visit:  Drug-induced insomnia - Plan: traZODone (DESYREL) 50 MG tablet  Anxiety disorder, unspecified type - Plan: escitalopram (LEXAPRO) 10 MG tablet, hydrOXYzine (VISTARIL) 25 MG capsule  Sinus tachycardia  Changes today: Lexapro 10 mg daily added. Hydroxyzine 1/2-1 tab up to 3 times daily if needed for acute anxiety. It causes drowsiness, so you can not drive or engage in activities that increase risk for injuries.  Metoprolol to be increased to the whole tab 2 times daily. Continue monitoring heart rate and let me know if you decide to see cardiologist. Continue Trazodone 1/2 tab at bedtime. Arrange an appt with psychotherapist. Keep next appt. If you need refills for medications you take chronically, please call your pharmacy. Do not use My Chart to request refills or for acute issues that need immediate attention. If you send a my chart message, it may take a few days to be addressed, specially if I am not in the office.  Please be sure medication list is accurate. If a new problem present, please set up appointment sooner than planned today.

## 2023-04-07 NOTE — Assessment & Plan Note (Addendum)
Problem has not improved at all after starting Trazodone. She tried Lexapro 10 mg added today, it helped before for anxiety. Hydroxyzine 25 mg 0.5-1 tab tid prn for acute anxiety.  We discussed some side effects of meds and the risk of med interaction.  CBT recommended. Keep next appt.

## 2023-04-07 NOTE — Assessment & Plan Note (Signed)
Problem is not well controlled. EKG's done during ED evaluations 01/2023 sinus tach and 12/2022 early repolarization, ? IVCD. Metoprolol Tartrate increased from 12.5 mg bid to 25 mg bid.  Side effects discussed. Continue monitoring HR. She prefers to continue holding on cardiologist referral. Instructed about warning signs. Keep next appt in 04/2023.

## 2023-04-07 NOTE — Assessment & Plan Note (Signed)
Problem has improved. Continue Trazodone 50 mg 1/2 tab daily at bedtime. Good sleep hygiene to continue.

## 2023-04-12 DIAGNOSIS — Z1151 Encounter for screening for human papillomavirus (HPV): Secondary | ICD-10-CM | POA: Diagnosis not present

## 2023-04-12 DIAGNOSIS — Z130 Encounter for screening for diseases of the blood and blood-forming organs and certain disorders involving the immune mechanism: Secondary | ICD-10-CM | POA: Diagnosis not present

## 2023-04-12 DIAGNOSIS — Z124 Encounter for screening for malignant neoplasm of cervix: Secondary | ICD-10-CM | POA: Diagnosis not present

## 2023-04-12 DIAGNOSIS — R87810 Cervical high risk human papillomavirus (HPV) DNA test positive: Secondary | ICD-10-CM | POA: Diagnosis not present

## 2023-04-12 DIAGNOSIS — Z01419 Encounter for gynecological examination (general) (routine) without abnormal findings: Secondary | ICD-10-CM | POA: Diagnosis not present

## 2023-04-12 DIAGNOSIS — Z113 Encounter for screening for infections with a predominantly sexual mode of transmission: Secondary | ICD-10-CM | POA: Diagnosis not present

## 2023-04-25 NOTE — Progress Notes (Signed)
HPI: Ms.Kasidy Dhondt is a 37 y.o. female, who is here today to follow on recent visit. 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  She has not yet started Humira, waiting on insurance approval.  The dose of prednisone has been decreased, and currently she is on 20 mg daily and planning on continue weaning off medication. She is hoping that she will be off of prednisone in about 6 months.  She was last seen on 04/03/23, when we added Lexapro 10 mg daily and Hydroxyzine for anxiety.  She reported improvement of anxiety after dose of prednisone was decreased, so she did not start Lexapro and did not establish with psychotherapist. She has taken hydroxyzine once, which helped with acute anxiety.  Insomnia:Sleep has also improved, she is currently taking trazodone 50 mg 1/2 tablet at bedtime. She is sleeping about six to seven hours at night.   Sinus tachycardia: She is  taking metoprolol titrate 25 mg twice daily, which has significantly reduced heart palpitations. Negative for CP, dyspnea, orthopnea, PND, or edema. She reported not frequently checking her heart rate at home. Checking BP's, usually around 130s over high 80s.   Lab Results  Component Value Date   CREATININE 0.83 02/14/2023   BUN 16 02/14/2023   NA 136 02/14/2023   K 3.8 02/14/2023   CL 102 02/14/2023   CO2 21 (L) 02/14/2023   She has had elevated leukocytes, attributed to prednisone. Negative for fever, night sweats, or abnormal weight loss. Lab Results  Component Value Date   WBC 23.4 (H) 02/14/2023   HGB 13.9 02/14/2023   HCT 43.0 02/14/2023   MCV 88.5 02/14/2023   PLT 318 02/14/2023   Lab Results  Component Value Date   TSH 3.44 12/14/2020   Today she is inquiring about ZepBound for weight loss. She has been trying to manage weight through walking and reducing eating out. She is interested in consultation with  weight loss specialist.  Review of Systems  Constitutional:  Negative for activity change, appetite change and chills.  Gastrointestinal:  Negative for abdominal pain, nausea and vomiting.  Endocrine: Negative for cold intolerance and heat intolerance.  Genitourinary:  Negative for decreased urine volume, dysuria and hematuria.  Skin:  Negative for rash.  Neurological:  Negative for syncope, facial asymmetry and weakness.  Psychiatric/Behavioral:  Negative for confusion and hallucinations.   See other pertinent positives and negatives in HPI.  Current Outpatient Medications on File Prior to Visit  Medication Sig Dispense Refill   acetaminophen (TYLENOL) 325 MG tablet Take 650 mg by mouth every 6 (six) hours as needed.     hydrOXYzine (VISTARIL) 25 MG capsule Take 1 capsule (25 mg total) by mouth every 8 (eight) hours as needed for anxiety. 30 capsule 0   metoprolol tartrate (LOPRESSOR) 25 MG tablet Take 1 tablet (25 mg total) by mouth 2 (two) times daily. 60 tablet 3   predniSONE (DELTASONE) 20 MG tablet Take 25 mg by mouth daily with breakfast.     traZODone (DESYREL) 50 MG tablet Take 0.5 tablets (25 mg total) by mouth at bedtime. 30 tablet 1   No current facility-administered medications on file prior to visit.   Past Medical History:  Diagnosis Date   Anxiety    Ectopic pregnancy    No Known Allergies  Social History   Socioeconomic History   Marital status: Married    Spouse name: Not  on file   Number of children: Not on file   Years of education: Not on file   Highest education level: Not on file  Occupational History   Not on file  Tobacco Use   Smoking status: Former    Packs/day: 0.50    Years: 10.00    Additional pack years: 0.00    Total pack years: 5.00    Types: Cigarettes   Smokeless tobacco: Never  Vaping Use   Vaping Use: Never used  Substance and Sexual Activity   Alcohol use: Yes    Comment: rare   Drug use: No   Sexual activity: Yes    Birth  control/protection: None  Other Topics Concern   Not on file  Social History Narrative   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     Social Determinants of Health   Financial Resource Strain: Not on file  Food Insecurity: Not on file  Transportation Needs: Not on file  Physical Activity: Not on file  Stress: Not on file  Social Connections: Not on file   Vitals:   04/27/23 0804  BP: 112/70  Pulse: 94  Resp: 12  Temp: 98.2 F (36.8 C)  SpO2: 95%   Body mass index is 31.45 kg/m.  Physical Exam Vitals and nursing note reviewed.  Constitutional:      General: She is not in acute distress.    Appearance: She is well-developed.  HENT:     Head: Normocephalic and atraumatic.     Mouth/Throat:     Mouth: Mucous membranes are moist.  Eyes:     Conjunctiva/sclera: Conjunctivae normal.  Cardiovascular:     Rate and Rhythm: Normal rate and regular rhythm.     Pulses:          Dorsalis pedis pulses are 2+ on the right side and 2+ on the left side.     Heart sounds: No murmur heard. Pulmonary:     Effort: Pulmonary effort is normal. No respiratory distress.     Breath sounds: Normal breath sounds.  Abdominal:     Palpations: Abdomen is soft. There is no mass.     Tenderness: There is no abdominal tenderness.  Lymphadenopathy:     Cervical: No cervical adenopathy.  Skin:    General: Skin is warm.     Findings: No erythema or rash.  Neurological:     General: No focal deficit present.     Mental Status: She is alert and oriented to person, place, and time.     Cranial Nerves: No cranial nerve deficit.     Gait: Gait normal.  Psychiatric:        Mood and Affect: Mood and affect normal.   ASSESSMENT AND PLAN:  Ms.Danylle was seen today for medical management of chronic issues.  Diagnoses and all orders for this visit: Lab Results  Component Value Date   TSH 4.57 04/27/2023   Sinus  tachycardia Assessment & Plan: Reports great improvement. Currently on Metoprolol Tartrate 25 mg bid, no changes today. Continue monitoring BP. Will re-evaluate once her Prednisone has been further decreased or discontinued.  Orders: -     TSH; Future  Drug-induced insomnia (HCC) Assessment & Plan: Sleeping better after prednisone dose has been decreased. Continue Trazodone 50 mg 1/2 tab daily at bedtime and good sleep hygiene. F/U in 5 months.   Anxiety disorder, unspecified type Assessment & Plan: Has  improved after decreasing Prednisone. She does not think Lexapro is needed or CBT. Continue Trazodone 50 mg 1/2 tab at bedtime, which has helped with sleep.   Obesity (BMI 30.0-34.9) Assessment & Plan: She understands the benefits of wt loss as well as adverse effects of obesity. Consistency with healthy diet and physical activity encouraged. Prednisone can aggravate problem. Making positive life style changes, small steps at the time, will provide long lasting results with minimal risk for adverse effects; so I do not recommend pharmacologic treatment at this time but she is interested in establishing with wt loss specialist.  Orders: -     Amb Ref to Medical Weight Management  Encounter for HCV screening test for low risk patient -     Hepatitis C antibody; Future   Return in about 5 months (around 09/27/2023) for chronic problems.  Kingslee Mairena G. [place], MD  [facility] [place] [facility].

## 2023-04-26 DIAGNOSIS — H1045 Other chronic allergic conjunctivitis: Secondary | ICD-10-CM | POA: Diagnosis not present

## 2023-04-26 DIAGNOSIS — H15042 Scleritis with corneal involvement, left eye: Secondary | ICD-10-CM | POA: Diagnosis not present

## 2023-04-26 DIAGNOSIS — H15123 Nodular episcleritis, bilateral: Secondary | ICD-10-CM | POA: Diagnosis not present

## 2023-04-27 ENCOUNTER — Encounter: Payer: Self-pay | Admitting: Family Medicine

## 2023-04-27 ENCOUNTER — Ambulatory Visit: Payer: BC Managed Care – PPO | Admitting: Family Medicine

## 2023-04-27 VITALS — BP 112/70 | HR 94 | Temp 98.2°F | Resp 12 | Ht 71.0 in | Wt 225.5 lb

## 2023-04-27 DIAGNOSIS — F419 Anxiety disorder, unspecified: Secondary | ICD-10-CM

## 2023-04-27 DIAGNOSIS — Z1159 Encounter for screening for other viral diseases: Secondary | ICD-10-CM

## 2023-04-27 DIAGNOSIS — E669 Obesity, unspecified: Secondary | ICD-10-CM

## 2023-04-27 DIAGNOSIS — F19982 Other psychoactive substance use, unspecified with psychoactive substance-induced sleep disorder: Secondary | ICD-10-CM

## 2023-04-27 DIAGNOSIS — R000 Tachycardia, unspecified: Secondary | ICD-10-CM | POA: Diagnosis not present

## 2023-04-27 DIAGNOSIS — Z6831 Body mass index (BMI) 31.0-31.9, adult: Secondary | ICD-10-CM

## 2023-04-27 LAB — TSH: TSH: 4.57 u[IU]/mL (ref 0.35–5.50)

## 2023-04-27 NOTE — Assessment & Plan Note (Signed)
Reports great improvement. Currently on Metoprolol Tartrate 25 mg bid, no changes today. Continue monitoring BP. Will re-evaluate once her Prednisone has been further decreased or discontinued.

## 2023-04-27 NOTE — Patient Instructions (Signed)
A few things to remember from today's visit:  Sinus tachycardia  Drug-induced insomnia (HCC)  Anxiety disorder, unspecified type  Obesity (BMI 30.0-34.9) - Plan: Amb Ref to Medical Weight Management  No changes today. Will see you back in 5 months and we can decide then if we can wean off some of your meds.  If you need refills for medications you take chronically, please call your pharmacy. Do not use My Chart to request refills or for acute issues that need immediate attention. If you send a my chart message, it may take a few days to be addressed, specially if I am not in the office.  Please be sure medication list is accurate. If a new problem present, please set up appointment sooner than planned today.

## 2023-04-27 NOTE — Assessment & Plan Note (Signed)
Has improved after decreasing Prednisone. She does not think Lexapro is needed or CBT. Continue Trazodone 50 mg 1/2 tab at bedtime, which has helped with sleep.

## 2023-04-27 NOTE — Assessment & Plan Note (Signed)
Sleeping better after prednisone dose has been decreased. Continue Trazodone 50 mg 1/2 tab daily at bedtime and good sleep hygiene. F/U in 5 months.

## 2023-04-27 NOTE — Assessment & Plan Note (Signed)
She understands the benefits of wt loss as well as adverse effects of obesity. Consistency with healthy diet and physical activity encouraged. Prednisone can aggravate problem. Making positive life style changes, small steps at the time, will provide long lasting results with minimal risk for adverse effects; so I do not recommend pharmacologic treatment at this time but she is interested in establishing with wt loss specialist.

## 2023-04-28 LAB — HEPATITIS C ANTIBODY: Hepatitis C Ab: NONREACTIVE

## 2023-08-08 ENCOUNTER — Encounter (INDEPENDENT_AMBULATORY_CARE_PROVIDER_SITE_OTHER): Payer: Self-pay | Admitting: Family Medicine

## 2023-08-08 DIAGNOSIS — H15042 Scleritis with corneal involvement, left eye: Secondary | ICD-10-CM | POA: Diagnosis not present

## 2023-08-08 DIAGNOSIS — H15123 Nodular episcleritis, bilateral: Secondary | ICD-10-CM | POA: Diagnosis not present

## 2023-08-17 IMAGING — CT CT ANGIO HEAD-NECK (W OR W/O PERF)
1 of 11 series · 5 of 33 positions shown · IV contrast (Omnipaque)
Comparison: None.

CLINICAL DATA: Central vertigo

EXAM:
CT ANGIOGRAPHY HEAD AND NECK
TECHNIQUE: Multidetector CT imaging of the head and neck was performed using
the standard protocol during bolus administration of intravenous
contrast. Multiplanar CT image reconstructions and MIPs were
obtained to evaluate the vascular anatomy. Carotid stenosis
measurements (when applicable) are obtained utilizing NASCET
criteria, using the distal internal carotid diameter as the
denominator.

[Series 14: axial thin · axial · 0.52mm/px · z∈[-202,+54]mm · 5 of 384 slices shown]
[im 64/384  soft-tissue]
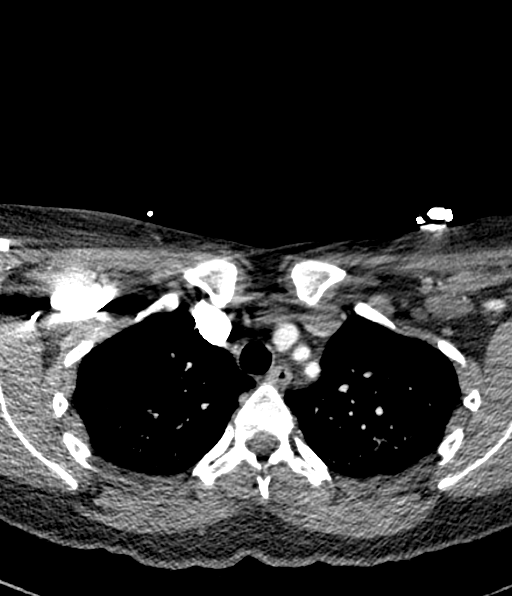
[im 128/384  bone]
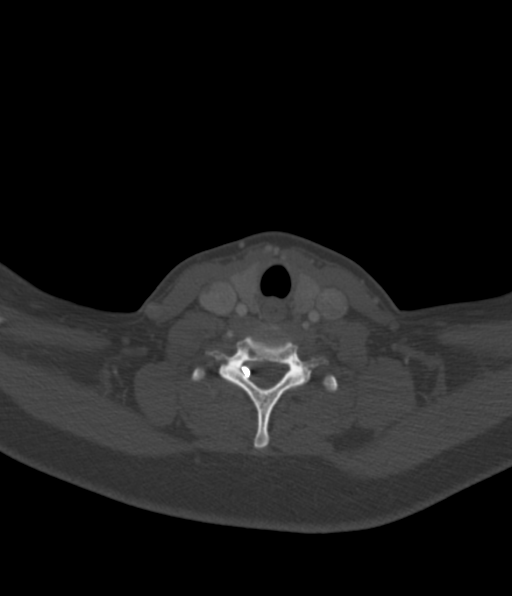
[im 192/384  soft-tissue]
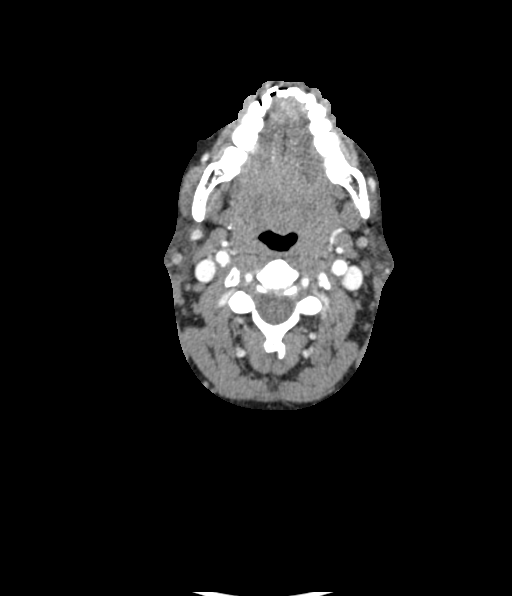
[im 256/384  bone]
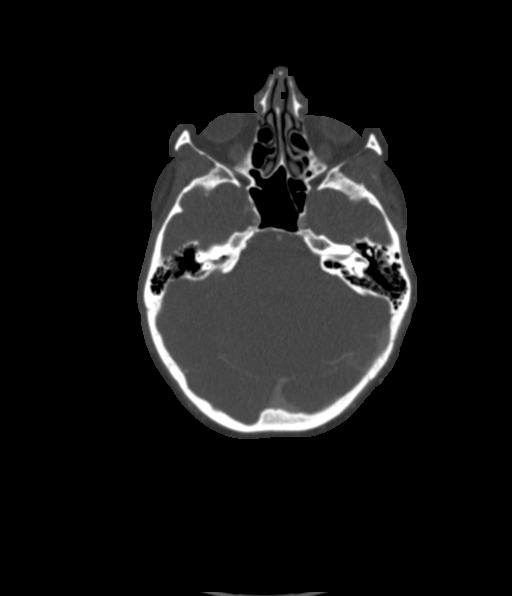
[im 320/384  soft-tissue]
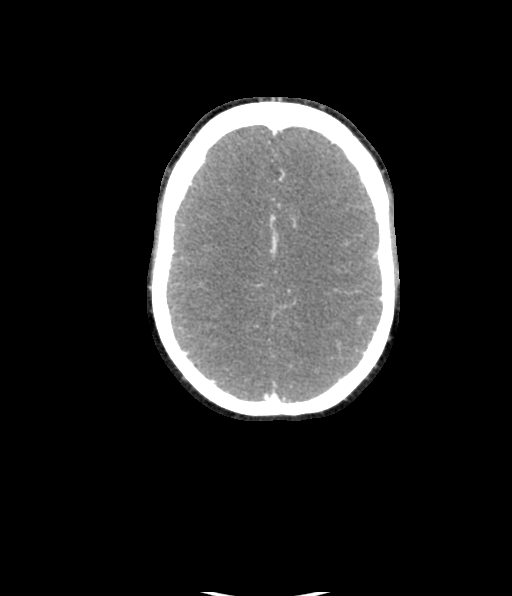

[5 of 33 positions shown; findings below may reference images not displayed]

RADIATION DOSE REDUCTION: This exam was performed according to the
departmental dose-optimization program which includes automated
exposure control, adjustment of the mA and/or kV according to
patient size and/or use of iterative reconstruction technique.

CONTRAST:  75mL OMNIPAQUE IOHEXOL 350 MG/ML SOLN
FINDINGS: CT HEAD FINDINGS

Brain: There is no acute intracranial hemorrhage, extra-axial fluid
collection, or acute infarct

Parenchymal volume is normal. The ventricles are normal in size.
Gray-white differentiation is preserved

There is no mass lesion.  There is no mass effect or midline shift.

Vascular: See below.

Skull: Normal. Negative for fracture or focal lesion.

Sinuses: There is a small retention cyst in the right maxillary
sinus.

Orbits: The globes and orbits are unremarkable.

Review of the MIP images confirms the above findings

CTA NECK FINDINGS

Aortic arch: The aortic arch is normal. The origins of the major
branch vessels are patent. Subclavian arteries are patent to the
level imaged.

Right carotid system: The right common, internal, and external
carotid arteries are patent, without hemodynamically significant
stenosis or occlusion. There is no dissection or aneurysm.

Left carotid system: The left common, internal, and external carotid
arteries are patent, without hemodynamically significant stenosis or
occlusion. There is no dissection or aneurysm.

Vertebral arteries: Vertebral arteries are patent, without
hemodynamically significant stenosis or occlusion. There is no
dissection or aneurysm.

Skeleton: There is no acute osseous abnormality or suspicious
osseous lesion. There is no visible canal hematoma.

Other neck: Soft tissues are unremarkable.

Upper chest: The imaged lung apices are clear.

Review of the MIP images confirms the above findings

CTA HEAD FINDINGS

Anterior circulation:

The intracranial ICAs are patent.

Bilateral MCAs are patent.

The bilateral ACAs are patent. The anterior communicating artery is
normal.

There is no aneurysm or AVM.

Posterior circulation:

The bilateral V4 segments are patent.  The basilar artery is patent.

The bilateral PCAs are patent. Bilateral posterior communicating
arteries are identified, left larger than right.

There is no aneurysm or AVM.

Venous sinuses: Patent.

Anatomic variants: None.

Review of the MIP images confirms the above findings
IMPRESSION: 1. Normal noncontrast head CT with no acute intracranial pathology.
2. Normal CTA of the head and neck with no stenosis, dissection, or
occlusion.

## 2023-08-23 ENCOUNTER — Encounter: Payer: Self-pay | Admitting: Family Medicine

## 2023-09-03 DIAGNOSIS — Z79899 Other long term (current) drug therapy: Secondary | ICD-10-CM | POA: Diagnosis not present

## 2023-09-03 DIAGNOSIS — Z8669 Personal history of other diseases of the nervous system and sense organs: Secondary | ICD-10-CM | POA: Diagnosis not present

## 2023-09-03 DIAGNOSIS — H209 Unspecified iridocyclitis: Secondary | ICD-10-CM | POA: Diagnosis not present

## 2023-09-03 DIAGNOSIS — R768 Other specified abnormal immunological findings in serum: Secondary | ICD-10-CM | POA: Diagnosis not present

## 2023-09-03 DIAGNOSIS — H5789 Other specified disorders of eye and adnexa: Secondary | ICD-10-CM | POA: Diagnosis not present

## 2023-10-01 ENCOUNTER — Encounter: Payer: Self-pay | Admitting: Family Medicine

## 2023-10-01 NOTE — Progress Notes (Signed)
HPI: [name] is a 37 y.o. female with a PMHx significant for GERD, anxiety, insomnia, episcleritis, and sinus tachycardia, who is here today for her routine physical.  Last CPE: More than one year ago. She saw gynecology on 04/12/2023.   Exercise: She has been walking 4-5x per week for 20-30 minutes.  Diet: She has been cooking at home, eating vegetables daily, and eating mainly chicken. She has lost ~20 lbs since her last visit.   Sleep: She sleeps 6-7 hours per night. She is no longer taking trazodone.  Alcohol Use: She drinks every few months, usually when on vacation.  Smoking: She quit smoking earlier this year in February.  Vision: UTD on routine vision care. She sees her ophthalmologist every 3 months.  Dental: UTD on routine dental care.   Immunization History  Administered Date(s) Administered   Influenza, Seasonal, Injecte, Preservative Fre 10/02/2023   MMR 11/24/2009   Tdap 11/24/2009, 07/14/2015   Health Maintenance  Topic Date Due   COVID-19 Vaccine (1) 10/17/2023 (Originally 11/04/1991)   Cervical Cancer Screening (HPV/Pap Cotest)  04/06/2025   DTaP/Tdap/Td (3 - Td or Tdap) 07/13/2025   INFLUENZA VACCINE  Completed   Hepatitis C Screening  Completed   HIV Screening  Completed   HPV VACCINES  Aged Out   Chronic medical problems:   Anterior bilateral scleritis: She is still taking prednisone but dose has been gradually decreased and now on 5 mg daily.  She sees rheumatology every 6 months. She is on Humira.  Sinus tach has improved, she is no longer taking metoprolol tartrate 25 mg bid.   Review of Systems  Constitutional:  Negative for activity change, appetite change, fatigue and fever.  HENT:  Negative for hearing loss, mouth sores, sore throat and trouble swallowing.   Eyes:  Negative for redness and visual disturbance.  Respiratory:  Negative for cough, shortness of breath and wheezing.   Cardiovascular:  Negative for chest pain and leg  swelling.  Gastrointestinal:  Negative for abdominal pain, nausea and vomiting.       No changes in bowel habits.  Endocrine: Negative for cold intolerance, heat intolerance, polydipsia, polyphagia and polyuria.  Genitourinary:  Negative for decreased urine volume, dysuria, hematuria, vaginal bleeding and vaginal discharge.  Musculoskeletal:  Negative for gait problem and myalgias.  Skin:  Negative for color change and rash.  Allergic/Immunologic: Negative for environmental allergies.  Neurological:  Negative for seizures, syncope, weakness and headaches.  Hematological:  Negative for adenopathy. Does not bruise/bleed easily.  Psychiatric/Behavioral:  Negative for confusion and hallucinations.   All other systems reviewed and are negative.  Current Outpatient Medications on File Prior to Visit  Medication Sig Dispense Refill   acetaminophen (TYLENOL) 325 MG tablet Take 650 mg by mouth every 6 (six) hours as needed.     adalimumab (HUMIRA, 2 PEN,) 40 MG/0.4ML pen Inject into the skin.     hydrOXYzine (VISTARIL) 25 MG capsule Take 1 capsule (25 mg total) by mouth every 8 (eight) hours as needed for anxiety. 30 capsule 0   predniSONE (DELTASONE) 5 MG tablet Take 5 mg by mouth daily with breakfast.     tirzepatide (ZEPBOUND) 10 MG/0.5ML Pen Inject 10 mg into the skin once a week.     No current facility-administered medications on file prior to visit.   Past Medical History:  Diagnosis Date   Anxiety    Ectopic pregnancy    Past Surgical History:  Procedure Laterality Date   KNEE SURGERY Left  arthroscopy   LYMPH NODE BIOPSY     benign, Left side of neck    UPPER GI ENDOSCOPY     No Known Allergies  Family History  Problem Relation Age of Onset   Breast cancer Mother    Hypertension Mother    Cancer Mother    Macular degeneration Mother    GER disease Mother    Colon cancer Father    Cancer Father    Hypertension Brother    Obesity Brother    Hypertension Maternal  Grandmother    Cancer Maternal Grandfather    Skin cancer Maternal Grandfather    Alcohol abuse Neg Hx    Arthritis Neg Hx    Asthma Neg Hx    Birth defects Neg Hx    COPD Neg Hx    Depression Neg Hx    Diabetes Neg Hx    Drug abuse Neg Hx    Early death Neg Hx    Hearing loss Neg Hx    Heart disease Neg Hx    Hyperlipidemia Neg Hx    Kidney disease Neg Hx    Learning disabilities Neg Hx    Mental illness Neg Hx    Mental retardation Neg Hx    Miscarriages / Stillbirths Neg Hx    Stroke Neg Hx    Vision loss Neg Hx    Varicose Veins Neg Hx     Social History   Socioeconomic History   Marital status: Married    Spouse name: Not on file   Number of children: Not on file   Years of education: Not on file   Highest education level: Not on file  Occupational History   Not on file  Tobacco Use   Smoking status: Former    Current packs/day: 0.50    Average packs/day: 0.5 packs/day for 10.0 years (5.0 ttl pk-yrs)    Types: Cigarettes   Smokeless tobacco: Never  Vaping Use   Vaping status: Never Used  Substance and Sexual Activity   Alcohol use: Yes    Comment: rare   Drug use: No   Sexual activity: Yes    Birth control/protection: None  Other Topics Concern   Not on file  Social History Narrative   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     Social Determinants of Health   Financial Resource Strain: Not on file  Food Insecurity: Not on file  Transportation Needs: Not on file  Physical Activity: Not on file  Stress: Not on file  Social Connections: Not on file   Vitals:   10/02/23 0802  BP: 124/80  Pulse: 96  Resp: 12  Temp: 97.7 F (36.5 C)  SpO2: 99%  Body mass index is 28.52 kg/m. Wt Readings from Last 3 Encounters:  10/02/23 204 lb 8 oz (92.8 kg)  04/27/23 225 lb 8 oz (102.3 kg)  04/03/23 222 lb 4 oz (100.8 kg)   Physical Exam Vitals and nursing note reviewed.   Constitutional:      General: She is not in acute distress.    Appearance: She is well-developed.  HENT:     Head: Normocephalic and atraumatic.     Right Ear: Tympanic membrane, ear canal and external ear normal.     Left Ear: Tympanic membrane, ear canal and external ear normal.     Mouth/Throat:     Mouth: Mucous membranes are moist.  Pharynx: Oropharynx is clear. Uvula midline.  Eyes:     General: Lids are everted, no foreign bodies appreciated.     Extraocular Movements: Extraocular movements intact.     Conjunctiva/sclera: Conjunctivae normal.     Pupils: Pupils are equal, round, and reactive to light.  Neck:     Thyroid: No thyromegaly.     Trachea: No tracheal deviation.  Cardiovascular:     Rate and Rhythm: Normal rate and regular rhythm.     Pulses:          Dorsalis pedis pulses are 2+ on the right side and 2+ on the left side.     Heart sounds: No murmur heard. Pulmonary:     Effort: Pulmonary effort is normal. No respiratory distress.     Breath sounds: Normal breath sounds.  Abdominal:     Palpations: Abdomen is soft. There is no hepatomegaly or mass.     Tenderness: There is no abdominal tenderness.  Genitourinary:    Comments: Deferred to gyn. Musculoskeletal:     Right lower leg: No edema.     Left lower leg: No edema.     Comments: No major deformity or signs of synovitis appreciated.  Lymphadenopathy:     Cervical: No cervical adenopathy.     Upper Body:     Right upper body: No supraclavicular adenopathy.     Left upper body: No supraclavicular adenopathy.  Skin:    General: Skin is warm.     Findings: No erythema or rash.  Neurological:     General: No focal deficit present.     Mental Status: She is alert and oriented to person, place, and time.     Cranial Nerves: No cranial nerve deficit.     Coordination: Coordination normal.     Gait: Gait normal.     Deep Tendon Reflexes:     Reflex Scores:      Bicep reflexes are 2+ on the right side  and 2+ on the left side.      Patellar reflexes are 2+ on the right side and 2+ on the left side. Psychiatric:        Mood and Affect: Mood and affect normal.   ASSESSMENT AND PLAN:  Ms. Megan Pearson was here today for her annual physical examination.  Orders Placed This Encounter  Procedures   Flu vaccine trivalent PF, 6mos and older(Flulaval,Afluria,Fluarix,Fluzone)   Lipid panel   Lab Results  Component Value Date   CHOL 196 10/02/2023   HDL 43.20 10/02/2023   LDLCALC 121 (H) 10/02/2023   TRIG 161.0 (H) 10/02/2023   CHOLHDL 5 10/02/2023   Routine general medical examination at a health care facility Assessment & Plan: We discussed the importance of regular physical activity and healthy diet for prevention of chronic illness and/or complications. Preventive guidelines reviewed. Vaccination up-to-date. Continue her female preventive care with gynecologist. Next CPE in a year.   Need for influenza vaccination -     Flu vaccine trivalent PF, 6mos and older(Flulaval,Afluria,Fluarix,Fluzone)  Screening for lipoid disorders -     Lipid panel; Future  Return in 1 year (on 10/01/2024) for CPE.  I, Rolla Etienne Wierda, acting as a scribe for Rangel Echeverri [place], MD., have documented all relevant documentation on the behalf of Kassadi Presswood [place], MD, as directed by  Micharl Helmes [place], MD while in the presence of Drequan Ironside [place], MD.   I, Elvis Laufer [place], MD, have reviewed all documentation for this visit. The documentation on 10/02/23 for the exam,  diagnosis, procedures, and orders are all accurate and complete.  Chrisie Jankovich G. [place], MD  [facility]. [place] office.

## 2023-10-02 ENCOUNTER — Encounter: Payer: Self-pay | Admitting: Family Medicine

## 2023-10-02 ENCOUNTER — Ambulatory Visit (INDEPENDENT_AMBULATORY_CARE_PROVIDER_SITE_OTHER): Payer: BC Managed Care – PPO | Admitting: Family Medicine

## 2023-10-02 VITALS — BP 124/80 | HR 96 | Temp 97.7°F | Resp 12 | Ht 71.0 in | Wt 204.5 lb

## 2023-10-02 DIAGNOSIS — Z0000 Encounter for general adult medical examination without abnormal findings: Secondary | ICD-10-CM | POA: Diagnosis not present

## 2023-10-02 DIAGNOSIS — Z23 Encounter for immunization: Secondary | ICD-10-CM

## 2023-10-02 DIAGNOSIS — Z13220 Encounter for screening for lipoid disorders: Secondary | ICD-10-CM

## 2023-10-02 LAB — LIPID PANEL
Cholesterol: 196 mg/dL (ref 0–200)
HDL: 43.2 mg/dL (ref >39.00)
LDL Cholesterol: 121 mg/dL — ABNORMAL HIGH (ref 0–99)
NonHDL: 152.73
Total CHOL/HDL Ratio: 5
Triglycerides: 161 mg/dL — ABNORMAL HIGH (ref 0.0–149.0)
VLDL: 32.2 mg/dL (ref 0.0–40.0)

## 2023-10-02 NOTE — Assessment & Plan Note (Signed)
We discussed the importance of regular physical activity and healthy diet for prevention of chronic illness and/or complications. Preventive guidelines reviewed. Vaccination up-to-date. Continue her female preventive care with gynecologist. Next CPE in a year. 

## 2023-10-02 NOTE — Patient Instructions (Addendum)
A few things to remember from today's visit:  Routine general medical examination at a health care facility  Need for influenza vaccination - Plan: Flu vaccine trivalent PF, 6mos and older(Flulaval,Afluria,Fluarix,Fluzone)  Screening for lipoid disorders  Screening for endocrine, metabolic and immunity disorder  Do not use My Chart to request refills or for acute issues that need immediate attention. If you send a my chart message, it may take a few days to be addressed, specially if I am not in the office.  Please be sure medication list is accurate. If a new problem present, please set up appointment sooner than planned today.  Health Maintenance, Female Adopting a healthy lifestyle and getting preventive care are important in promoting health and wellness. Ask your health care provider about: The right schedule for you to have regular tests and exams. Things you can do on your own to prevent diseases and keep yourself healthy. What should I know about diet, weight, and exercise? Eat a healthy diet  Eat a diet that includes plenty of vegetables, fruits, low-fat dairy products, and lean protein. Do not eat a lot of foods that are high in solid fats, added sugars, or sodium. Maintain a healthy weight Body mass index (BMI) is used to identify weight problems. It estimates body fat based on height and weight. Your health care provider can help determine your BMI and help you achieve or maintain a healthy weight. Get regular exercise Get regular exercise. This is one of the most important things you can do for your health. Most adults should: Exercise for at least 150 minutes each week. The exercise should increase your heart rate and make you sweat (moderate-intensity exercise). Do strengthening exercises at least twice a week. This is in addition to the moderate-intensity exercise. Spend less time sitting. Even light physical activity can be beneficial. Watch cholesterol and blood  lipids Have your blood tested for lipids and cholesterol at 38 years of age, then have this test every 5 years. Have your cholesterol levels checked more often if: Your lipid or cholesterol levels are high. You are older than 38 years of age. You are at high risk for heart disease. What should I know about cancer screening? Depending on your health history and family history, you may need to have cancer screening at various ages. This may include screening for: Breast cancer. Cervical cancer. Colorectal cancer. Skin cancer. Lung cancer. What should I know about heart disease, diabetes, and high blood pressure? Blood pressure and heart disease High blood pressure causes heart disease and increases the risk of stroke. This is more likely to develop in people who have high blood pressure readings or are overweight. Have your blood pressure checked: Every 3-5 years if you are 64-2 years of age. Every year if you are 72 years old or older. Diabetes Have regular diabetes screenings. This checks your fasting blood sugar level. Have the screening done: Once every three years after age 35 if you are at a normal weight and have a low risk for diabetes. More often and at a younger age if you are overweight or have a high risk for diabetes. What should I know about preventing infection? Hepatitis B If you have a higher risk for hepatitis B, you should be screened for this virus. Talk with your health care provider to find out if you are at risk for hepatitis B infection. Hepatitis C Testing is recommended for: Everyone born from [birth year] through 1966. Anyone with known risk factors for hepatitis  C. Sexually transmitted infections (STIs) Get screened for STIs, including gonorrhea and chlamydia, if: You are sexually active and are younger than 38 years of age. You are older than 38 years of age and your health care provider tells you that you are at risk for this type of infection. Your sexual  activity has changed since you were last screened, and you are at increased risk for chlamydia or gonorrhea. Ask your health care provider if you are at risk. Ask your health care provider about whether you are at high risk for HIV. Your health care provider may recommend a prescription medicine to help prevent HIV infection. If you choose to take medicine to prevent HIV, you should first get tested for HIV. You should then be tested every 3 months for as long as you are taking the medicine. Pregnancy If you are about to stop having your period (premenopausal) and you may become pregnant, seek counseling before you get pregnant. Take 400 to 800 micrograms (mcg) of folic acid every day if you become pregnant. Ask for birth control (contraception) if you want to prevent pregnancy. Osteoporosis and menopause Osteoporosis is a disease in which the bones lose minerals and strength with aging. This can result in bone fractures. If you are 7 years old or older, or if you are at risk for osteoporosis and fractures, ask your health care provider if you should: Be screened for bone loss. Take a calcium or vitamin D supplement to lower your risk of fractures. Be given hormone replacement therapy (HRT) to treat symptoms of menopause. Follow these instructions at home: Alcohol use Do not drink alcohol if: Your health care provider tells you not to drink. You are pregnant, may be pregnant, or are planning to become pregnant. If you drink alcohol: Limit how much you have to: 0-1 drink a day. Know how much alcohol is in your drink. In the [place] one drink equals one 12 oz bottle of beer (355 mL), one 5 oz glass of wine (148 mL), or one 1 oz glass of hard liquor (44 mL). Lifestyle Do not use any products that contain nicotine or tobacco. These products include cigarettes, chewing tobacco, and vaping devices, such as e-cigarettes. If you need help quitting, ask your health care provider. Do not use street  drugs. Do not share needles. Ask your health care provider for help if you need support or information about quitting drugs. General instructions Schedule regular health, dental, and eye exams. Stay current with your vaccines. Tell your health care provider if: You often feel depressed. You have ever been abused or do not feel safe at home. Summary Adopting a healthy lifestyle and getting preventive care are important in promoting health and wellness. Follow your health care provider's instructions about healthy diet, exercising, and getting tested or screened for diseases. Follow your health care provider's instructions on monitoring your cholesterol and blood pressure. This information is not intended to replace advice given to you by your health care provider. Make sure you discuss any questions you have with your health care provider. Document Revised: 05/02/2021 Document Reviewed: 05/02/2021 Elsevier Patient Education  2024 [employer].

## 2023-10-08 DIAGNOSIS — D2272 Melanocytic nevi of left lower limb, including hip: Secondary | ICD-10-CM | POA: Diagnosis not present

## 2023-10-08 DIAGNOSIS — D2271 Melanocytic nevi of right lower limb, including hip: Secondary | ICD-10-CM | POA: Diagnosis not present

## 2023-10-08 DIAGNOSIS — D2262 Melanocytic nevi of left upper limb, including shoulder: Secondary | ICD-10-CM | POA: Diagnosis not present

## 2023-10-08 DIAGNOSIS — D2261 Melanocytic nevi of right upper limb, including shoulder: Secondary | ICD-10-CM | POA: Diagnosis not present

## 2023-10-15 ENCOUNTER — Ambulatory Visit: Payer: BC Managed Care – PPO | Admitting: Orthopedic Surgery

## 2023-10-15 ENCOUNTER — Encounter: Payer: Self-pay | Admitting: Orthopedic Surgery

## 2023-10-15 ENCOUNTER — Other Ambulatory Visit (INDEPENDENT_AMBULATORY_CARE_PROVIDER_SITE_OTHER): Payer: BC Managed Care – PPO

## 2023-10-15 DIAGNOSIS — M25562 Pain in left knee: Secondary | ICD-10-CM | POA: Diagnosis not present

## 2023-10-15 NOTE — Progress Notes (Signed)
Office Visit Note   Patient: Megan Pearson           Date of Birth: [birth year]           MRN: [id number] Visit Date: 10/15/2023              Requested by: [place], Betty G, MD [address] [place],  [place] [address] PCP: [place], Betty G, MD  Chief Complaint  Patient presents with   Left Knee - Pain    Hx of knee surgery 20 years ago.      HPI: Patient is a 37 year old woman with acute pain left knee patellofemoral joint.  Patient states she had arthroscopic surgery about 20 years ago for a meniscal tear.  Patient states she has pain over the patellofemoral joint pain has persisted for 1 week.  She has no mechanical symptoms.  Assessment & Plan: Visit Diagnoses:  1. Acute pain of left knee     Plan: Patient has lateral tracking of the patella with VMO weakness.  Recommended VMO strengthening with close chain kinetic exercises and isometric straight leg raises.  Follow-Up Instructions: Return if symptoms worsen or fail to improve.   Ortho Exam  Patient is alert, oriented, no adenopathy, well-dressed, normal affect, normal respiratory effort. Examination patient has VMO atrophy.  She is tender to palpation over the patellofemoral joint medially and laterally and has lateral tracking of the patella.  Medial lateral joint lines are nontender to palpation collaterals and cruciates are stable.  Patient is on Humira and prednisone for a autoimmune arthritis.  Imaging: XR Knee 1-2 Views Left  Result Date: 10/15/2023 2 view radiographs of the left knee shows a congruent joint space equal to the right knee.  There is no osteophytic bone spurs no subcondylar sclerosis or cysts.  No images are attached to the encounter.  Labs: Lab Results  Component Value Date   HGBA1C 5.4 03/02/2023   ESRSEDRATE 22 (H) 12/14/2020   CRP 9.8 (H) 12/14/2020     Lab Results  Component Value Date   ALBUMIN 4.0 01/22/2023   ALBUMIN 4.3 04/24/2022   ALBUMIN 3.5 05/15/2014    No  results found for: "MG" No results found for: "VD25OH"  No results found for: "PREALBUMIN"    Latest Ref Rng & Units 02/14/2023    4:07 PM 01/22/2023    8:01 PM 04/24/2022    3:19 PM  CBC EXTENDED  WBC 4.0 - 10.5 K/uL 23.4  17.4  8.7   RBC 3.87 - 5.11 MIL/uL 4.86  4.27  4.61   Hemoglobin 12.0 - 15.0 g/dL 64.4  03.4  74.2   HCT 36.0 - 46.0 % 43.0  35.7  39.8   Platelets 150 - 400 K/uL 318  158  223   NEUT# 1.7 - 7.7 K/uL  13.7  5.0   Lymph# 0.7 - 4.0 K/uL  1.7  2.8      There is no height or weight on file to calculate BMI.  Orders:  Orders Placed This Encounter  Procedures   XR Knee 1-2 Views Left   No orders of the defined types were placed in this encounter.    Procedures: No procedures performed  Clinical Data: No additional findings.  ROS:  All other systems negative, except as noted in the HPI. Review of Systems  Objective: Vital Signs: LMP 09/20/2023 (Approximate)   Specialty Comments:  No specialty comments available.  PMFS History: Patient Active Problem List   Diagnosis Date Noted  Routine general medical examination at a health care facility 10/02/2023   Drug-induced insomnia (HCC) 03/16/2023   Sinus tachycardia 03/16/2023   Elevated blood pressure reading 03/02/2023   Scleritis 01/15/2023   Obesity (BMI 30.0-34.9) 12/14/2020   Melanocytic nevus 03/28/2019   Seborrheic keratoses 03/28/2019   Back pain 06/25/2016   Anxiety disorder, unspecified 05/16/2016   GERD (gastroesophageal reflux disease) 05/16/2016   Episcleritis 02/23/2015   Past Medical History:  Diagnosis Date   Anxiety    Ectopic pregnancy     Family History  Problem Relation Age of Onset   Breast cancer Mother    Hypertension Mother    Cancer Mother    Macular degeneration Mother    GER disease Mother    Colon cancer Father    Cancer Father    Hypertension Brother    Obesity Brother    Hypertension Maternal Grandmother    Cancer Maternal Grandfather    Skin cancer  Maternal Grandfather    Alcohol abuse Neg Hx    Arthritis Neg Hx    Asthma Neg Hx    Birth defects Neg Hx    COPD Neg Hx    Depression Neg Hx    Diabetes Neg Hx    Drug abuse Neg Hx    Early death Neg Hx    Hearing loss Neg Hx    Heart disease Neg Hx    Hyperlipidemia Neg Hx    Kidney disease Neg Hx    Learning disabilities Neg Hx    Mental illness Neg Hx    Mental retardation Neg Hx    Miscarriages / Stillbirths Neg Hx    Stroke Neg Hx    Vision loss Neg Hx    Varicose Veins Neg Hx     Past Surgical History:  Procedure Laterality Date   KNEE SURGERY Left    arthroscopy   LYMPH NODE BIOPSY     benign, Left side of neck    UPPER GI ENDOSCOPY     Social History   Occupational History   Not on file  Tobacco Use   Smoking status: Former    Current packs/day: 0.50    Average packs/day: 0.5 packs/day for 10.0 years (5.0 ttl pk-yrs)    Types: Cigarettes   Smokeless tobacco: Never  Vaping Use   Vaping status: Never Used  Substance and Sexual Activity   Alcohol use: Yes    Comment: rare   Drug use: No   Sexual activity: Yes    Birth control/protection: None

## 2023-10-17 DIAGNOSIS — H1045 Other chronic allergic conjunctivitis: Secondary | ICD-10-CM | POA: Diagnosis not present

## 2023-10-17 DIAGNOSIS — H5213 Myopia, bilateral: Secondary | ICD-10-CM | POA: Diagnosis not present

## 2023-10-18 ENCOUNTER — Ambulatory Visit: Payer: BC Managed Care – PPO | Admitting: Orthopedic Surgery

## 2023-10-18 ENCOUNTER — Encounter: Payer: Self-pay | Admitting: Orthopedic Surgery

## 2023-10-18 DIAGNOSIS — H31003 Unspecified chorioretinal scars, bilateral: Secondary | ICD-10-CM | POA: Diagnosis not present

## 2023-10-18 DIAGNOSIS — M25562 Pain in left knee: Secondary | ICD-10-CM | POA: Diagnosis not present

## 2023-10-18 DIAGNOSIS — H43823 Vitreomacular adhesion, bilateral: Secondary | ICD-10-CM | POA: Diagnosis not present

## 2023-10-18 NOTE — Progress Notes (Signed)
Office Visit Note   Patient: Megan Pearson           Date of Birth: 05-May-1986           MRN: [id number] Visit Date: 10/18/2023              Requested by: [place], Betty G, MD [address] [place],  [place] [address] PCP: [place], Betty G, MD  Chief Complaint  Patient presents with   Left Knee - Follow-up      HPI: Patient is a 37 year old woman who is seen in follow-up for left knee pain.  Patient states that initially the pain was medial to the patella she states is on the medial joint line and feels like a bruise..  She is status post arthroscopic debridement of her knee in the past.  Patient states she has not had any steroid injections in the past.  Assessment & Plan: Visit Diagnoses:  1. Acute pain of left knee     Plan: Patient's is not interested in a steroid injection at this time.  We will set her up for physical therapy for VMO strengthening of the left knee.  Discussed that if she does not improve with the therapy we would request an MRI scan of her left knee.  Follow-Up Instructions: Return in about 2 months (around 12/18/2023).   Ortho Exam  Patient is alert, oriented, no adenopathy, well-dressed, normal affect, normal respiratory effort. Examination there is no effusion no redness no cellulitis of the left knee she is tender to palpation over the medial joint line.  Imaging: No results found. No images are attached to the encounter.  Labs: Lab Results  Component Value Date   HGBA1C 5.4 03/02/2023   ESRSEDRATE 22 (H) 12/14/2020   CRP 9.8 (H) 12/14/2020     Lab Results  Component Value Date   ALBUMIN 4.0 01/22/2023   ALBUMIN 4.3 04/24/2022   ALBUMIN 3.5 05/15/2014    No results found for: "MG" No results found for: "VD25OH"  No results found for: "PREALBUMIN"    Latest Ref Rng & Units 02/14/2023    4:07 PM 01/22/2023    8:01 PM 04/24/2022    3:19 PM  CBC EXTENDED  WBC 4.0 - 10.5 K/uL 23.4  17.4  8.7   RBC 3.87 - 5.11 MIL/uL 4.86  4.27   4.61   Hemoglobin 12.0 - 15.0 g/dL 19.1  47.8  29.5   HCT 36.0 - 46.0 % 43.0  35.7  39.8   Platelets 150 - 400 K/uL 318  158  223   NEUT# 1.7 - 7.7 K/uL  13.7  5.0   Lymph# 0.7 - 4.0 K/uL  1.7  2.8      There is no height or weight on file to calculate BMI.  Orders:  No orders of the defined types were placed in this encounter.  No orders of the defined types were placed in this encounter.    Procedures: No procedures performed  Clinical Data: No additional findings.  ROS:  All other systems negative, except as noted in the HPI. Review of Systems  Objective: Vital Signs: LMP 09/20/2023 (Approximate)   Specialty Comments:  No specialty comments available.  PMFS History: Patient Active Problem List   Diagnosis Date Noted   Routine general medical examination at a health care facility 10/02/2023   Drug-induced insomnia (HCC) 03/16/2023   Sinus tachycardia 03/16/2023   Elevated blood pressure reading 03/02/2023   Scleritis 01/15/2023   Obesity (BMI 30.0-34.9)  12/14/2020   Melanocytic nevus 03/28/2019   Seborrheic keratoses 03/28/2019   Back pain 06/25/2016   Anxiety disorder, unspecified 05/16/2016   GERD (gastroesophageal reflux disease) 05/16/2016   Episcleritis 02/23/2015   Past Medical History:  Diagnosis Date   Anxiety    Ectopic pregnancy     Family History  Problem Relation Age of Onset   Breast cancer Mother    Hypertension Mother    Cancer Mother    Macular degeneration Mother    GER disease Mother    Colon cancer Father    Cancer Father    Hypertension Brother    Obesity Brother    Hypertension Maternal Grandmother    Cancer Maternal Grandfather    Skin cancer Maternal Grandfather    Alcohol abuse Neg Hx    Arthritis Neg Hx    Asthma Neg Hx    Birth defects Neg Hx    COPD Neg Hx    Depression Neg Hx    Diabetes Neg Hx    Drug abuse Neg Hx    Early death Neg Hx    Hearing loss Neg Hx    Heart disease Neg Hx    Hyperlipidemia Neg Hx     Kidney disease Neg Hx    Learning disabilities Neg Hx    Mental illness Neg Hx    Mental retardation Neg Hx    Miscarriages / Stillbirths Neg Hx    Stroke Neg Hx    Vision loss Neg Hx    Varicose Veins Neg Hx     Past Surgical History:  Procedure Laterality Date   KNEE SURGERY Left    arthroscopy   LYMPH NODE BIOPSY     benign, Left side of neck    UPPER GI ENDOSCOPY     Social History   Occupational History   Not on file  Tobacco Use   Smoking status: Former    Current packs/day: 0.50    Average packs/day: 0.5 packs/day for 10.0 years (5.0 ttl pk-yrs)    Types: Cigarettes   Smokeless tobacco: Never  Vaping Use   Vaping status: Never Used  Substance and Sexual Activity   Alcohol use: Yes    Comment: rare   Drug use: No   Sexual activity: Yes    Birth control/protection: None

## 2023-11-29 ENCOUNTER — Ambulatory Visit: Payer: BC Managed Care – PPO | Admitting: Orthopedic Surgery

## 2023-12-03 DIAGNOSIS — H5789 Other specified disorders of eye and adnexa: Secondary | ICD-10-CM | POA: Diagnosis not present

## 2023-12-03 DIAGNOSIS — Z8669 Personal history of other diseases of the nervous system and sense organs: Secondary | ICD-10-CM | POA: Diagnosis not present

## 2023-12-03 DIAGNOSIS — R768 Other specified abnormal immunological findings in serum: Secondary | ICD-10-CM | POA: Diagnosis not present

## 2023-12-03 DIAGNOSIS — H209 Unspecified iridocyclitis: Secondary | ICD-10-CM | POA: Diagnosis not present

## 2023-12-12 DIAGNOSIS — Z302 Encounter for sterilization: Secondary | ICD-10-CM | POA: Diagnosis not present

## 2023-12-12 DIAGNOSIS — Z3009 Encounter for other general counseling and advice on contraception: Secondary | ICD-10-CM | POA: Diagnosis not present

## 2024-02-12 DIAGNOSIS — Z01818 Encounter for other preprocedural examination: Secondary | ICD-10-CM | POA: Diagnosis not present

## 2024-02-18 ENCOUNTER — Encounter (HOSPITAL_COMMUNITY): Payer: Self-pay | Admitting: Obstetrics and Gynecology

## 2024-02-18 NOTE — Progress Notes (Addendum)
 Spoke w/ via phone for pre-op interview--- Megan Pearson Lab needs dos---- CBC, CMP and UPT        Lab results------ COVID test -----patient states asymptomatic no test needed Arrive at -------1100 NPO after MN NO Solid Food.  Clear liquids from MN until---1000 Pre-Surgery Ensure or G2:  Med rec completed Medications to take morning of surgery -----Hydroxyzine PRN Diabetic medication -----  GLP1 agonist last dose: Zepbound 02/05/24 GLP1 instructions: Hold next dose of Zepbound until after procedure,pt verbalized understanding.  Patient instructed no nail polish to be worn day of surgery Patient instructed to bring photo id and insurance card day of surgery Patient aware to have Driver (ride ) / caregiver    for 24 hours after surgery - Husband Megan Pearson. Patient Special Instructions ----- Shower with antibacterial soap. Pre-Op special Instructions -----  Patient verbalized understanding of instructions that were given at this phone interview. Patient denies chest pain, sob, fever, cough at the interview.

## 2024-02-23 NOTE — H&P (Signed)
 Megan Pearson is an 38 y.o. female. [id number] for [facility] Robot Assisted Bilateral Salpingectomy also IUD removal. Pt with undesired fertiliyu.  D/W pt r/b/a of surgery including but limited to: bleeding, infection, damage to surrounding organs, and slow healing.    Pertinent Gynecological History: [id number] SVD x 2 + abn pap, colp - last 04/12/23 HR HPV neg No STI Has paragard IUD  Menstrual History: Patient's last menstrual period was 02/03/2024 (exact date).    Past Medical History:  Diagnosis Date   Anxiety    Ectopic pregnancy   uveitis  Past Surgical History:  Procedure Laterality Date   KNEE SURGERY Left    arthroscopy   LYMPH NODE BIOPSY     benign, Left side of neck    UPPER GI ENDOSCOPY    Ectopic pregnancy surgery, WTE  Family History  Problem Relation Age of Onset   Breast cancer Mother    Hypertension Mother    Cancer Mother    Macular degeneration Mother    GER disease Mother    Colon cancer Father    Cancer Father    Hypertension Brother    Obesity Brother    Hypertension Maternal Grandmother    Cancer Maternal Grandfather    Skin cancer Maternal Grandfather    Alcohol abuse Neg Hx    Arthritis Neg Hx    Asthma Neg Hx    Birth defects Neg Hx    COPD Neg Hx    Depression Neg Hx    Diabetes Neg Hx    Drug abuse Neg Hx    Early death Neg Hx    Hearing loss Neg Hx    Heart disease Neg Hx    Hyperlipidemia Neg Hx    Kidney disease Neg Hx    Learning disabilities Neg Hx    Mental illness Neg Hx    Mental retardation Neg Hx    Miscarriages / Stillbirths Neg Hx    Stroke Neg Hx    Vision loss Neg Hx    Varicose Veins Neg Hx     Social History:  reports that she has quit smoking. Her smoking use included cigarettes. She has a 5 pack-year smoking history. She has never used smokeless tobacco. She reports current alcohol use. She reports that she does not use drugs. 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   HENT: Negative.    Respiratory: Negative.    Gastrointestinal: Negative.   Genitourinary: Negative.   Musculoskeletal: Negative.   Skin: Negative.   Neurological: Negative.   Psychiatric/Behavioral: Negative.      Height 5\' 11"  (1.803 m), weight 77.1 kg, last menstrual period 02/03/2024. Physical Exam Constitutional:      Appearance: Normal appearance.  HENT:     Head: Normocephalic and atraumatic.  Cardiovascular:     Rate and Rhythm: Normal rate and regular rhythm.  Pulmonary:     Effort: Pulmonary effort is normal.     Breath sounds: Normal breath sounds.  Abdominal:     General: Bowel sounds are normal.     Palpations: Abdomen is soft.  Genitourinary:    General: Normal vulva.     Rectum: Normal.  Musculoskeletal:        General: Normal range of motion.     Cervical back: Normal range of motion and neck supple.  Skin:    General: Skin is warm and dry.  Neurological:  General: No focal deficit present.     Mental Status: She is alert and oriented to person, place, and time.  Psychiatric:        Mood and Affect: Mood normal.        Behavior: Behavior normal.      Assessment/Plan: 37yp [id number] with undesired fertility for RA BS and paragard IUD removal D/w pt r/b/a of surgery Given post op meeds  Megan Pearson 02/23/2024, 8:23 PM

## 2024-02-25 ENCOUNTER — Ambulatory Visit (HOSPITAL_COMMUNITY): Payer: Self-pay | Admitting: Anesthesiology

## 2024-02-25 ENCOUNTER — Encounter (HOSPITAL_COMMUNITY)
Admission: RE | Disposition: A | Discharge status: Home / Self Care | Payer: Self-pay | Source: Home / Self Care | Attending: Obstetrics and Gynecology

## 2024-02-25 ENCOUNTER — Other Ambulatory Visit: Payer: Self-pay

## 2024-02-25 ENCOUNTER — Encounter (HOSPITAL_COMMUNITY): Payer: Self-pay | Admitting: Obstetrics and Gynecology

## 2024-02-25 ENCOUNTER — Ambulatory Visit (HOSPITAL_COMMUNITY)
Admission: RE | Admit: 2024-02-25 | Discharge: 2024-02-25 | Disposition: A | Discharge status: Home / Self Care | Payer: BC Managed Care – PPO | Attending: Obstetrics and Gynecology | Admitting: Obstetrics and Gynecology

## 2024-02-25 DIAGNOSIS — N7001 Acute salpingitis: Secondary | ICD-10-CM | POA: Diagnosis not present

## 2024-02-25 DIAGNOSIS — K219 Gastro-esophageal reflux disease without esophagitis: Secondary | ICD-10-CM | POA: Insufficient documentation

## 2024-02-25 DIAGNOSIS — Z30432 Encounter for removal of intrauterine contraceptive device: Secondary | ICD-10-CM | POA: Insufficient documentation

## 2024-02-25 DIAGNOSIS — Z302 Encounter for sterilization: Secondary | ICD-10-CM | POA: Diagnosis not present

## 2024-02-25 DIAGNOSIS — N7011 Chronic salpingitis: Secondary | ICD-10-CM | POA: Diagnosis not present

## 2024-02-25 DIAGNOSIS — Z87891 Personal history of nicotine dependence: Secondary | ICD-10-CM | POA: Diagnosis not present

## 2024-02-25 DIAGNOSIS — Z9079 Acquired absence of other genital organ(s): Secondary | ICD-10-CM

## 2024-02-25 HISTORY — PX: IUD REMOVAL: SHX5392

## 2024-02-25 HISTORY — PX: XI ROBOTIC ASSISTED SALPINGECTOMY: SHX6824

## 2024-02-25 LAB — COMPREHENSIVE METABOLIC PANEL
ALT: 10 U/L (ref 0–44)
AST: 13 U/L — ABNORMAL LOW (ref 15–41)
Albumin: 3.8 g/dL (ref 3.5–5.0)
Alkaline Phosphatase: 34 U/L — ABNORMAL LOW (ref 38–126)
Anion gap: 14 (ref 5–15)
BUN: 8 mg/dL (ref 6–20)
CO2: 17 mmol/L — ABNORMAL LOW (ref 22–32)
Calcium: 9.1 mg/dL (ref 8.9–10.3)
Chloride: 105 mmol/L (ref 98–111)
Creatinine, Ser: 0.7 mg/dL (ref 0.44–1.00)
GFR, Estimated: >60 mL/min (ref >60)
Glucose, Bld: 92 mg/dL (ref 70–99)
Potassium: 3.6 mmol/L (ref 3.5–5.1)
Sodium: 136 mmol/L (ref 135–145)
Total Bilirubin: 0.9 mg/dL (ref 0.0–1.2)
Total Protein: 7.1 g/dL (ref 6.5–8.1)

## 2024-02-25 LAB — CBC
HCT: 35.1 % — ABNORMAL LOW (ref 36.0–46.0)
Hemoglobin: 11.8 g/dL — ABNORMAL LOW (ref 12.0–15.0)
MCH: 29.1 pg (ref 26.0–34.0)
MCHC: 33.6 g/dL (ref 30.0–36.0)
MCV: 86.5 fL (ref 80.0–100.0)
Platelets: 175 10*3/uL (ref 150–400)
RBC: 4.06 MIL/uL (ref 3.87–5.11)
RDW: 13.2 % (ref 11.5–15.5)
WBC: 7.3 10*3/uL (ref 4.0–10.5)
nRBC: 0 % (ref 0.0–0.2)

## 2024-02-25 LAB — POCT PREGNANCY, URINE: Preg Test, Ur: NEGATIVE

## 2024-02-25 SURGERY — SALPINGECTOMY, ROBOT-ASSISTED
Anesthesia: General | Site: Uterus | Laterality: Bilateral

## 2024-02-25 MED ORDER — ONDANSETRON HCL 4 MG/2ML IJ SOLN
INTRAMUSCULAR | Status: AC
  Filled 2024-02-25: qty 2

## 2024-02-25 MED ORDER — LIDOCAINE 2% (20 MG/ML) 5 ML SYRINGE
INTRAMUSCULAR | Status: DC | PRN
  Administered 2024-02-25: 80 mg via INTRAVENOUS

## 2024-02-25 MED ORDER — FENTANYL CITRATE (PF) 250 MCG/5ML IJ SOLN
INTRAMUSCULAR | Status: DC | PRN
  Administered 2024-02-25: 100 ug via INTRAVENOUS
  Administered 2024-02-25: 50 ug via INTRAVENOUS

## 2024-02-25 MED ORDER — ONDANSETRON HCL 4 MG/2ML IJ SOLN
INTRAMUSCULAR | Status: DC | PRN
  Administered 2024-02-25: 4 mg via INTRAVENOUS

## 2024-02-25 MED ORDER — PHENYLEPHRINE 80 MCG/ML (10ML) SYRINGE FOR IV PUSH (FOR BLOOD PRESSURE SUPPORT)
PREFILLED_SYRINGE | INTRAVENOUS | Status: DC | PRN
  Administered 2024-02-25 (×2): 80 ug via INTRAVENOUS

## 2024-02-25 MED ORDER — PROPOFOL 10 MG/ML IV BOLUS
INTRAVENOUS | Status: DC | PRN
  Administered 2024-02-25: 150 mg via INTRAVENOUS

## 2024-02-25 MED ORDER — PHENYLEPHRINE 80 MCG/ML (10ML) SYRINGE FOR IV PUSH (FOR BLOOD PRESSURE SUPPORT)
PREFILLED_SYRINGE | INTRAVENOUS | Status: AC
  Filled 2024-02-25: qty 10

## 2024-02-25 MED ORDER — AMISULPRIDE (ANTIEMETIC) 5 MG/2ML IV SOLN: 10.0000 mg | Freq: Once | INTRAVENOUS | Status: DC | PRN

## 2024-02-25 MED ORDER — EPHEDRINE 5 MG/ML INJ
INTRAVENOUS | Status: AC
  Filled 2024-02-25: qty 5

## 2024-02-25 MED ORDER — FENTANYL CITRATE (PF) 100 MCG/2ML IJ SOLN
INTRAMUSCULAR | Status: AC
  Filled 2024-02-25: qty 2

## 2024-02-25 MED ORDER — LACTATED RINGERS IV SOLN: INTRAVENOUS | Status: DC

## 2024-02-25 MED ORDER — OXYCODONE HCL 5 MG PO TABS: 5.0000 mg | ORAL_TABLET | Freq: Once | ORAL | Status: DC | PRN

## 2024-02-25 MED ORDER — ROCURONIUM BROMIDE 10 MG/ML (PF) SYRINGE
PREFILLED_SYRINGE | INTRAVENOUS | Status: DC | PRN
Start: 2024-02-25 — End: 2024-02-25
  Administered 2024-02-25: 50 mg via INTRAVENOUS

## 2024-02-25 MED ORDER — SCOPOLAMINE 1 MG/3DAYS TD PT72
MEDICATED_PATCH | TRANSDERMAL | Status: DC
Start: 2024-02-25 — End: 2024-02-25
  Filled 2024-02-25: qty 1

## 2024-02-25 MED ORDER — CHLORHEXIDINE GLUCONATE 0.12 % MT SOLN: 15.0000 mL | Freq: Once | OROMUCOSAL | Status: DC

## 2024-02-25 MED ORDER — POVIDONE-IODINE 10 % EX SWAB: 2.0000 | Freq: Once | CUTANEOUS | Status: DC

## 2024-02-25 MED ORDER — ACETAMINOPHEN 500 MG PO TABS
1000.0000 mg | ORAL_TABLET | ORAL | Status: AC
  Administered 2024-02-25: 1000 mg via ORAL

## 2024-02-25 MED ORDER — FENTANYL CITRATE (PF) 100 MCG/2ML IJ SOLN
25.0000 ug | INTRAMUSCULAR | Status: DC | PRN
  Administered 2024-02-25: 25 ug via INTRAVENOUS

## 2024-02-25 MED ORDER — PROPOFOL 10 MG/ML IV BOLUS
INTRAVENOUS | Status: AC
  Filled 2024-02-25: qty 20

## 2024-02-25 MED ORDER — BUPIVACAINE HCL (PF) 0.25 % IJ SOLN
INTRAMUSCULAR | Status: AC
  Filled 2024-02-25: qty 30

## 2024-02-25 MED ORDER — FENTANYL CITRATE (PF) 250 MCG/5ML IJ SOLN
INTRAMUSCULAR | Status: AC
  Filled 2024-02-25: qty 5

## 2024-02-25 MED ORDER — ORAL CARE MOUTH RINSE: 15.0000 mL | Freq: Once | OROMUCOSAL | Status: DC

## 2024-02-25 MED ORDER — STERILE WATER FOR IRRIGATION IR SOLN
Status: DC | PRN
  Administered 2024-02-25: 1000 mL

## 2024-02-25 MED ORDER — SUGAMMADEX SODIUM 200 MG/2ML IV SOLN
INTRAVENOUS | Status: AC
  Filled 2024-02-25: qty 2

## 2024-02-25 MED ORDER — EPHEDRINE SULFATE (PRESSORS) 50 MG/ML IJ SOLN
INTRAMUSCULAR | Status: DC | PRN
  Administered 2024-02-25: 5 mg via INTRAVENOUS

## 2024-02-25 MED ORDER — SUGAMMADEX SODIUM 200 MG/2ML IV SOLN
INTRAVENOUS | Status: DC | PRN
  Administered 2024-02-25: 200 mg via INTRAVENOUS

## 2024-02-25 MED ORDER — MIDAZOLAM HCL 2 MG/2ML IJ SOLN
INTRAMUSCULAR | Status: DC | PRN
  Administered 2024-02-25: 2 mg via INTRAVENOUS

## 2024-02-25 MED ORDER — SCOPOLAMINE 1 MG/3DAYS TD PT72
1.0000 | MEDICATED_PATCH | TRANSDERMAL | Status: DC
  Administered 2024-02-25: 1.5 mg via TRANSDERMAL

## 2024-02-25 MED ORDER — OXYCODONE HCL 5 MG/5ML PO SOLN: 5.0000 mg | Freq: Once | ORAL | Status: DC | PRN

## 2024-02-25 MED ORDER — DEXAMETHASONE SODIUM PHOSPHATE 10 MG/ML IJ SOLN
INTRAMUSCULAR | Status: DC | PRN
  Administered 2024-02-25: 10 mg via INTRAVENOUS

## 2024-02-25 MED ORDER — CHLORHEXIDINE GLUCONATE 0.12 % MT SOLN
OROMUCOSAL | Status: AC
  Filled 2024-02-25: qty 15

## 2024-02-25 MED ORDER — BUPIVACAINE HCL (PF) 0.25 % IJ SOLN
INTRAMUSCULAR | Status: DC | PRN
  Administered 2024-02-25: 15 mL

## 2024-02-25 MED ORDER — MIDAZOLAM HCL 2 MG/2ML IJ SOLN
INTRAMUSCULAR | Status: AC
Start: 2024-02-25 — End: ?
  Filled 2024-02-25: qty 2

## 2024-02-25 MED ORDER — ACETAMINOPHEN 500 MG PO TABS
ORAL_TABLET | ORAL | Status: AC
  Filled 2024-02-25: qty 2

## 2024-02-25 SURGICAL SUPPLY — 51 items
CANNULA CAP OBTURATR AIRSEAL 8 (CAP) ×2 IMPLANT
COVER BACK TABLE 60X90IN (DRAPES) ×2 IMPLANT
COVER TIP SHEARS 8 DVNC (MISCELLANEOUS) ×2 IMPLANT
DEFOGGER SCOPE WARMER CLEARIFY (MISCELLANEOUS) ×2 IMPLANT
DERMABOND ADVANCED .7 DNX12 (GAUZE/BANDAGES/DRESSINGS) ×2 IMPLANT
DRAPE ARM DVNC X/XI (DISPOSABLE) ×8 IMPLANT
DRAPE COLUMN DVNC XI (DISPOSABLE) ×2 IMPLANT
DRAPE SURG IRRIG POUCH 19X23 (DRAPES) ×2 IMPLANT
DRAPE UTILITY XL STRL (DRAPES) ×2 IMPLANT
DURAPREP 26ML APPLICATOR (WOUND CARE) ×2 IMPLANT
ELECT REM PT RETURN 9FT ADLT (ELECTROSURGICAL) ×2 IMPLANT
ELECTRODE REM PT RTRN 9FT ADLT (ELECTROSURGICAL) ×2 IMPLANT
FORCEPS BPLR FENES DVNC XI (FORCEP) ×2 IMPLANT
GAUZE 4X4 16PLY ~~LOC~~+RFID DBL (SPONGE) ×2 IMPLANT
GLOVE BIO SURGEON STRL SZ 6.5 (GLOVE) ×6 IMPLANT
GLOVE BIO SURGEON STRL SZ7 (GLOVE) IMPLANT
GLOVE BIOGEL PI IND STRL 6 (GLOVE) IMPLANT
GLOVE BIOGEL PI IND STRL 7.0 (GLOVE) ×2 IMPLANT
GLOVE SURG SYN 6.5 ES PF (GLOVE) ×4 IMPLANT
GLOVE SURG SYN 6.5 PF PI (GLOVE) IMPLANT
GOWN STRL REUS W/TWL LRG LVL3 (GOWN DISPOSABLE) ×2 IMPLANT
IRRIG SUCT STRYKERFLOW 2 WTIP (MISCELLANEOUS) ×2 IMPLANT
IRRIGATION SUCT STRKRFLW 2 WTP (MISCELLANEOUS) ×2 IMPLANT
KIT PINK PAD W/HEAD ARE REST (MISCELLANEOUS) ×2 IMPLANT
KIT PINK PAD W/HEAD ARM REST (MISCELLANEOUS) ×2 IMPLANT
KIT TURNOVER KIT B (KITS) ×2 IMPLANT
LEGGING LITHOTOMY PAIR STRL (DRAPES) ×2 IMPLANT
NDL INSUFFLATION 14GA 120MM (NEEDLE) ×2 IMPLANT
NEEDLE INSUFFLATION 14GA 120MM (NEEDLE) ×2 IMPLANT
OBTURATOR OPTICAL STND 8 DVNC (TROCAR) ×2 IMPLANT
OBTURATOR OPTICALSTD 8 DVNC (TROCAR) ×2 IMPLANT
PACK ROBOT WH (CUSTOM PROCEDURE TRAY) ×2 IMPLANT
PACK ROBOTIC GOWN (GOWN DISPOSABLE) ×2 IMPLANT
PACK VAGINAL MINOR WOMEN LF (CUSTOM PROCEDURE TRAY) ×2 IMPLANT
PAD OB MATERNITY 11 LF (PERSONAL CARE ITEMS) ×2 IMPLANT
PAD PREP 24X48 CUFFED NSTRL (MISCELLANEOUS) ×2 IMPLANT
PROTECTOR NERVE ULNAR (MISCELLANEOUS) ×2 IMPLANT
SCISSORS MNPLR CVD DVNC XI (INSTRUMENTS) ×4 IMPLANT
SEAL UNIV 5-12 XI (MISCELLANEOUS) ×4 IMPLANT
SEALER VESSEL EXT DVNC XI (MISCELLANEOUS) IMPLANT
SET IRRIG Y TYPE TUR BLADDER L (SET/KITS/TRAYS/PACK) IMPLANT
SET TUBE FILTERED XL AIRSEAL (SET/KITS/TRAYS/PACK) ×2 IMPLANT
SLEEVE SCD COMPRESS KNEE MED (STOCKING) ×2 IMPLANT
SPIKE FLUID TRANSFER (MISCELLANEOUS) ×4 IMPLANT
SUT VIC AB 0 CT1 27XBRD ANBCTR (SUTURE) IMPLANT
SUT VIC AB 4-0 PS2 18 (SUTURE) ×6 IMPLANT
SUT VICRYL 0 UR6 27IN ABS (SUTURE) IMPLANT
TOWEL GREEN STERILE (TOWEL DISPOSABLE) ×4 IMPLANT
TOWEL OR 17X24 6PK STRL BLUE (TOWEL DISPOSABLE) ×2 IMPLANT
TRAY FOLEY W/BAG SLVR 16FR ST (SET/KITS/TRAYS/PACK) IMPLANT
WATER STERILE IRR 1000ML POUR (IV SOLUTION) ×2 IMPLANT

## 2024-02-25 NOTE — Discharge Instructions (Signed)

## 2024-02-25 NOTE — Anesthesia Procedure Notes (Addendum)
 Procedure Name: Intubation Date/Time: 02/25/2024 12:43 PM  Performed by: Thomasene Ripple, CRNAPre-anesthesia Checklist: Patient identified, Emergency Drugs available, Suction available and Patient being monitored Patient Re-evaluated:Patient Re-evaluated prior to induction Oxygen Delivery Method: Circle System Utilized Preoxygenation: Pre-oxygenation with 100% oxygen Induction Type: IV induction Ventilation: Mask ventilation without difficulty Laryngoscope Size: Miller and 2 Grade View: Grade I Tube type: Oral Tube size: 7.0 mm Number of attempts: 1 Airway Equipment and Method: Stylet and Bite block Placement Confirmation: ETT inserted through vocal cords under direct vision, positive ETCO2 and breath sounds checked- equal and bilateral Secured at: 22 cm Tube secured with: Tape Dental Injury: Teeth and Oropharynx as per pre-operative assessment  Comments: Intubation by Sherron Flemings

## 2024-02-25 NOTE — Anesthesia Preprocedure Evaluation (Addendum)
 Anesthesia Evaluation  Patient identified by MRN, date of birth, ID band Patient awake    Reviewed: Allergy & Precautions, NPO status , Patient's Chart, lab work & pertinent test results  History of Anesthesia Complications Negative for: history of anesthetic complications  Airway Mallampati: II  TM Distance: >3 FB Neck ROM: Full    Dental  (+) Dental Advisory Given   Pulmonary neg shortness of breath, neg sleep apnea, neg COPD, neg recent URI, former smoker   breath sounds clear to auscultation       Cardiovascular negative cardio ROS  Rhythm:Regular Rate:Normal     Neuro/Psych  PSYCHIATRIC DISORDERS Anxiety     negative neurological ROS     GI/Hepatic Neg liver ROS,GERD  ,,  Endo/Other  negative endocrine ROS    Renal/GU negative Renal ROS     Musculoskeletal   Abdominal   Peds  Hematology negative hematology ROS (+)   Anesthesia Other Findings Last Zepbound: 2-3 weeks ago  Reproductive/Obstetrics                             Anesthesia Physical Anesthesia Plan  ASA: 2  Anesthesia Plan: General   Post-op Pain Management: Tylenol PO (pre-op)*   Induction: Intravenous  PONV Risk Score and Plan: 3 and Ondansetron, Dexamethasone, Treatment may vary due to age or medical condition, Scopolamine patch - Pre-op and Midazolam  Airway Management Planned: Oral ETT  Additional Equipment:   Intra-op Plan:   Post-operative Plan: Extubation in OR  Informed Consent: I have reviewed the patients History and Physical, chart, labs and discussed the procedure including the risks, benefits and alternatives for the proposed anesthesia with the patient or authorized representative who has indicated his/her understanding and acceptance.     Dental advisory given  Plan Discussed with: CRNA and Anesthesiologist  Anesthesia Plan Comments: (Risks of general anesthesia discussed including, but not  limited to, sore throat, hoarse voice, chipped/damaged teeth, injury to vocal cords, nausea and vomiting, allergic reactions, lung infection, heart attack, stroke, and death. All questions answered. )        Anesthesia Quick Evaluation

## 2024-02-25 NOTE — Progress Notes (Signed)
 Left voice mail for pt. To return call to [phone] to verify time change in schedule to come in at 1000 instead of 1100 and to stop cl. Liquids now

## 2024-02-25 NOTE — Interval H&P Note (Signed)
 History and Physical Interval Note:  02/25/2024 12:21 PM  Megan Pearson  has presented today for surgery, with the diagnosis of sterilization Z30.2.  The various methods of treatment have been discussed with the patient and family. After consideration of risks, benefits and other options for treatment, the patient has consented to  Procedure(s) with comments: XI ROBOTIC ASSISTED SALPINGECTOMY (Bilateral) - [facility] INTRAUTERINE DEVICE (IUD) REMOVAL (Bilateral) as a surgical intervention.  The patient's history has been reviewed, patient examined, no change in status, stable for surgery.  I have reviewed the patient's chart and labs.  Questions were answered to the patient's satisfaction.     Seyed Heffley Bovard-Stuckert

## 2024-02-25 NOTE — Progress Notes (Signed)
 MEDICATION WASTE:  Client received Fentanyl IV per [facility] orders Post Op Wasted in Med Room with Margarita Mail RN

## 2024-02-25 NOTE — Anesthesia Postprocedure Evaluation (Signed)
 Anesthesia Post Note  Patient: Megan Pearson  Procedure(s) Performed: XI ROBOTIC ASSISTED SALPINGECTOMY (Bilateral: Pelvis) INTRAUTERINE DEVICE (IUD) REMOVAL (Bilateral: Uterus)     Patient location during evaluation: PACU Anesthesia Type: General Level of consciousness: awake Pain management: pain level controlled Vital Signs Assessment: post-procedure vital signs reviewed and stable Respiratory status: spontaneous breathing, nonlabored ventilation and respiratory function stable Cardiovascular status: blood pressure returned to baseline and stable Postop Assessment: no apparent nausea or vomiting Anesthetic complications: no   No notable events documented.  Last Vitals:  Vitals:   02/25/24 1112 02/25/24 1335  BP: 109/72   Pulse: 72 80  Resp: 16 14  Temp: 36.7 C   SpO2: 99% 100%    Last Pain:  Vitals:   02/25/24 1112  TempSrc: Oral  PainSc: 0-No pain                 Linton Rump

## 2024-02-25 NOTE — Op Note (Addendum)
 [name], Megan Pearson MEDICAL RECORD NO: [id number] ACCOUNT NO: [id number] DATE OF BIRTH: August 15, 1986 FACILITY: [facility] LOCATION: [facility] PHYSICIAN: Sherian Rein, MD  Operative Report   DATE OF PROCEDURE: 02/25/2024  PREOPERATIVE DIAGNOSIS:  Undesired fertility.  POSTOPERATIVE DIAGNOSIS:  Undesired fertility.  PROCEDURE:  Da Vinci robot-assisted bilateral salpingectomy, removal of Paragard IUD.  SURGEON:  Sherian Rein, MD  ASSISTANT:  Herma Mering, RNFA.  ESTIMATED BLOOD LOSS:  10 mL  INTRAVENOUS FLUID AND URINE OUTPUT:  Per anesthesia staff.  COMPLICATIONS:  None.  PATHOLOGY:  Bilateral tubal segments.  DESCRIPTION OF PROCEDURE:  After informed consent was reviewed with the patient including the risks, benefits, and alternatives of surgical procedures, she was transferred to the operating room and placed on the table in the supine position.  General  anesthesia was induced and found to be adequate.  She was then placed in the Yellofin stirrups, prepped and draped in the normal sterile fashion.  After an appropriate timeout was performed, a Hulka manipulator was placed inside the uterus.  Catheter was  placed to drain her bladder.  Gloves and gown were changed.  Attention was turned to the abdominal portion of the case. Approximately 8 mm supraumbilical incision was made.  The fascia was cleared off with hemostats.  A Veress needle was used to obtain  pneumoperitoneum after passing the hanging drop test with an opening pressure of 3 mmHg.  The trocar was placed under direct visualization at the umbilicus.  Accessory ports were placed in both the right and left under direct visualization.  The uterus  was easily visualized appearing normal as were the tubes and ovaries. The robot was docked.    After the robot was docked, surgeon broke scrub and was seated at console.  Normal anatomy was again confirmed.  The left tube was grasped at the fimbriated end and the tube was  excised to the level of the cornua and placed in the anterior cul-de-sac.  The right tube was then grasped at the  fimbriated end and excised to the level of the cornua, placed in the cul-de-sac.  The tubes were removed.  Hemostasis was assured.  Instrumentation and trocars were removed.  The incisions were closed with 4-0 Vicryl and Dermabond.   Her IUD strings were  grasped at her cervix and removed in its entirety.  This was confirmed by staff.  The cervix was noted to be hemostatic.  The patient tolerated the procedure well.  Sponge, lap and needle count was correct x2 per the operating room staff.   VAI D: 02/25/2024 1:36:59 pm T: 02/25/2024 9:57:00 pm  JOB: [id number]/ [id number]

## 2024-02-25 NOTE — Discharge Instr - Supplementary Instructions (Signed)
May take Tylenol after 5:15pm if needed for discomfort.

## 2024-02-25 NOTE — Transfer of Care (Signed)
 Immediate Anesthesia Transfer of Care Note  Patient: Jeremie Abdelaziz  Procedure(s) Performed: XI ROBOTIC ASSISTED SALPINGECTOMY (Bilateral: Pelvis) INTRAUTERINE DEVICE (IUD) REMOVAL (Bilateral: Uterus)  Patient Location: PACU  Anesthesia Type:General  Level of Consciousness: awake, alert , and oriented  Airway & Oxygen Therapy: Patient Spontanous Breathing and Patient connected to face mask oxygen  Post-op Assessment: Report given to RN and Post -op Vital signs reviewed and stable  Post vital signs: Reviewed and stable  Last Vitals:  Vitals Value Taken Time  BP 101/63 02/25/24 1331  Temp    Pulse 80 02/25/24 1334  Resp 14 02/25/24 1334  SpO2 100 % 02/25/24 1334  Vitals shown include unfiled device data.  Last Pain:  Vitals:   02/25/24 1112  TempSrc: Oral  PainSc: 0-No pain      Patients Stated Pain Goal: 6 (02/25/24 1112)  Complications: No notable events documented.

## 2024-02-25 NOTE — Brief Op Note (Signed)
 02/25/2024  1:31 PM  PATIENT:  Megan Pearson  38 y.o. female  PRE-OPERATIVE DIAGNOSIS:  sterilization Z30.2  POST-OPERATIVE DIAGNOSIS:  sterilization Z30.2  PROCEDURE:  Procedure(s) with comments: XI ROBOTIC ASSISTED SALPINGECTOMY (Bilateral) - [facility] INTRAUTERINE DEVICE (IUD) REMOVAL (Bilateral)  SURGEON:  Surgeons and Role:    * Bovard-Stuckert, Augusto Gamble, MD - Primary  ASSISTANTS: Megan Pearson RNFA   ANESTHESIA:   local and general  EBL:  10 mL IVF and uop per anesthesia notes  BLOOD ADMINISTERED:none  DRAINS: none   LOCAL MEDICATIONS USED:  MARCAINE     SPECIMEN:  Source of Specimen:  Bilateral fallopian tubes  DISPOSITION OF SPECIMEN:  PATHOLOGY  COUNTS:  YES  TOURNIQUET:  * No tourniquets in log *  DICTATION: .Other Dictation: Dictation Number [id number]  PLAN OF CARE: Discharge to home after PACU  PATIENT DISPOSITION:  PACU - hemodynamically stable.   Delay start of Pharmacological VTE agent (>24hrs) due to surgical blood loss or risk of bleeding: not applicable

## 2024-02-26 ENCOUNTER — Encounter (HOSPITAL_COMMUNITY): Payer: Self-pay | Admitting: Obstetrics and Gynecology

## 2024-02-26 LAB — SURGICAL PATHOLOGY

## 2024-02-27 MED FILL — Chlorhexidine Gluconate Soln 0.12%: OROMUCOSAL | Qty: 15 | Status: AC

## 2024-03-03 DIAGNOSIS — Z8669 Personal history of other diseases of the nervous system and sense organs: Secondary | ICD-10-CM | POA: Diagnosis not present

## 2024-03-03 DIAGNOSIS — R768 Other specified abnormal immunological findings in serum: Secondary | ICD-10-CM | POA: Diagnosis not present

## 2024-03-03 DIAGNOSIS — H5789 Other specified disorders of eye and adnexa: Secondary | ICD-10-CM | POA: Diagnosis not present

## 2024-03-03 DIAGNOSIS — H209 Unspecified iridocyclitis: Secondary | ICD-10-CM | POA: Diagnosis not present

## 2024-03-18 DIAGNOSIS — Z6823 Body mass index (BMI) 23.0-23.9, adult: Secondary | ICD-10-CM | POA: Diagnosis not present

## 2024-03-18 DIAGNOSIS — Z20828 Contact with and (suspected) exposure to other viral communicable diseases: Secondary | ICD-10-CM | POA: Diagnosis not present

## 2024-03-31 ENCOUNTER — Encounter: Payer: Self-pay | Admitting: Family Medicine

## 2024-03-31 ENCOUNTER — Ambulatory Visit (INDEPENDENT_AMBULATORY_CARE_PROVIDER_SITE_OTHER): Admitting: Family Medicine

## 2024-03-31 VITALS — BP 118/70 | HR 94 | Resp 12 | Ht 71.0 in | Wt 165.1 lb

## 2024-03-31 DIAGNOSIS — L659 Nonscarring hair loss, unspecified: Secondary | ICD-10-CM

## 2024-03-31 DIAGNOSIS — D509 Iron deficiency anemia, unspecified: Secondary | ICD-10-CM

## 2024-03-31 NOTE — Patient Instructions (Addendum)
 A few things to remember from today's visit:  Hair loss disorder - Plan: TSH, Iron, Ferritin  Iron deficiency anemia, unspecified iron deficiency anemia type - Plan: Iron, Ferritin Saw Palmetto oil may help as well as Rogaine.   Do not use My Chart to request refills or for acute issues that need immediate attention. If you send a my chart message, it may take a few days to be addressed, specially if I am not in the office.  Please be sure medication list is accurate. If a new problem present, please set up appointment sooner than planned today.

## 2024-03-31 NOTE — Progress Notes (Unsigned)
 ACUTE VISIT Chief Complaint  Patient presents with   Alopecia    Ongoing x a year, does have appt with dermatology but not for a few months    HPI: MeganMegan Pearson is a 38 y.o. female with a PMHx significant for GERD, anxiety,and anterior uveitis on Humira who is here today complaining of hair loss as described above.    Problem has been going on for a little longer than a year. She says she is losing hair from everywhere on her scalp and noted that hair is thinner. She hasn't noticed any specific bald spots.  She has lost a significant amount of weight, but says that has been gradual and related to dietary changes.  No significant FMHx of alopecia.  She has been taking oral vitamins and occasionally using an anti-fungal shampoo.  She has an appointment scheduled with dermatology, but it is in 05/2024.   Lab Results  Component Value Date   WBC 7.3 02/25/2024   HGB 11.8 (L) 02/25/2024   HCT 35.1 (L) 02/25/2024   MCV 86.5 02/25/2024   PLT 175 02/25/2024   Lab Results  Component Value Date   TSH 4.57 04/27/2023   Review of Systems  Constitutional:  Negative for activity change, appetite change and fever.  HENT:  Negative for mouth sores and sore throat.   Respiratory:  Negative for cough, shortness of breath and wheezing.   Cardiovascular:  Negative for chest pain and leg swelling.  Gastrointestinal:  Negative for abdominal pain, nausea and vomiting.  Endocrine: Negative for cold intolerance and heat intolerance.  Genitourinary:  Negative for decreased urine volume and hematuria.  Skin:  Negative for rash.  Neurological:  Negative for syncope, weakness and headaches.  Psychiatric/Behavioral:  Negative for confusion and hallucinations.   See other pertinent positives and negatives in HPI.  Current Outpatient Medications on File Prior to Visit  Medication Sig Dispense Refill   adalimumab (HUMIRA, 2 PEN,) 40 MG/0.4ML pen Inject 40 mg into the skin every other day.      fluticasone (FLONASE) 50 MCG/ACT nasal spray Place into both nostrils daily.     hydrOXYzine (VISTARIL) 25 MG capsule Take 1 capsule (25 mg total) by mouth every 8 (eight) hours as needed for anxiety. 30 capsule 0   ibuprofen (ADVIL) 200 MG tablet Take 400 mg by mouth every 6 (six) hours as needed for headache.     meclizine (ANTIVERT) 25 MG tablet Take 25 mg by mouth 3 (three) times daily as needed for dizziness.     Multiple Vitamins-Minerals (MULTIVITAMIN WITH MINERALS) tablet Take 1 tablet by mouth daily.     prednisoLONE acetate (PRED FORTE) 1 % ophthalmic suspension Place 1 drop into both eyes daily as needed (eye inflammation).     tirzepatide (ZEPBOUND) 12.5 MG/0.5ML Pen Inject 12.5 mg into the skin once a week.     No current facility-administered medications on file prior to visit.   Past Medical History:  Diagnosis Date   Anxiety    Ectopic pregnancy    No Known Allergies  Social History   Socioeconomic History   Marital status: Married    Spouse name: Not on file   Number of children: Not on file   Years of education: Not on file   Highest education level: Not on file  Occupational History   Not on file  Tobacco Use   Smoking status: Former    Current packs/day: 0.50    Average packs/day: 0.5 packs/day for 10.0 years (5.0  ttl pk-yrs)    Types: Cigarettes   Smokeless tobacco: Never  Vaping Use   Vaping status: Never Used  Substance and Sexual Activity   Alcohol use: Yes    Comment: rare   Drug use: No   Sexual activity: Yes    Birth control/protection: None  Other Topics Concern   Not on file  Social History Narrative   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     Social Drivers of Corporate investment banker Strain: Not on file  Food Insecurity: Not on file  Transportation Needs: Not on file  Physical Activity: Not on file  Stress: Not on file  Social Connections: Not on file    Vitals:   03/31/24 1449  BP: 118/70  Pulse: 94  Resp: 12  SpO2: 99%   Body mass index is 23.03 kg/m.  Physical Exam Vitals and nursing note reviewed.  Constitutional:      General: She is not in acute distress.    Appearance: She is well-developed.  HENT:     Head: Normocephalic and atraumatic.  Eyes:     Conjunctiva/sclera: Conjunctivae normal.  Cardiovascular:     Rate and Rhythm: Normal rate and regular rhythm.     Heart sounds: No murmur heard. Pulmonary:     Effort: Pulmonary effort is normal. No respiratory distress.     Breath sounds: Normal breath sounds.  Lymphadenopathy:     Cervical: No cervical adenopathy.  Skin:    General: Skin is warm.     Findings: No erythema or rash.     Comments: Scalp with no skin lesion or areas of alopecia.  Hair pull test negative x 2.  Neurological:     General: No focal deficit present.     Mental Status: She is alert and oriented to person, place, and time.     Gait: Gait normal.  Psychiatric:        Mood and Affect: Mood and affect normal.   ASSESSMENT AND PLAN:  Ms. Voth was seen today for hair loss.  *** Hair loss disorder It has been going on for over a year. We discussed possible etiologies, some of her chronic comorbidities as well as medications could be aggravating problem. She reported gradual wt loss, noted on med list Zebound. Hx of positive ANA's, she has seen rheumatologist and currently on Humira for anterior uveitis. Local massage, saw palmetto oil and OTC Rogaine may help.  We reviewed other treatment options. Pending appointment with dermatologist in 05/2024. Further recommendations will be given according to lab results.  -     TSH; Future -     Iron -     Ferritin  Iron deficiency anemia, unspecified iron deficiency anemia type Mild and after surgical procedure. She is on a daily multivitamin. Iron studies ordered today, recommendations will be given accordingly.  -     Iron -      Ferritin  Return if symptoms worsen or fail to improve.  I, Rolla Etienne Wierda, acting as a scribe for Syona Wroblewski [place], MD., have documented all relevant documentation on the behalf of Megan Ballengee [place], MD, as directed by  Javani Spratt [place], MD while in the presence of Niharika Savino [place], MD.   I, Aveen Stansel [place], MD, have reviewed all documentation for this visit. The documentation on 03/31/24 for the exam, diagnosis, procedures, and orders are all accurate and complete.  Lidiya Reise G. [place], MD  [facility]. [place] office.

## 2024-04-01 ENCOUNTER — Encounter: Payer: Self-pay | Admitting: Family Medicine

## 2024-04-01 LAB — IRON: Iron: 43 ug/dL (ref 42–145)

## 2024-04-01 LAB — TSH: TSH: 2.24 u[IU]/mL (ref 0.35–5.50)

## 2024-04-01 LAB — FERRITIN: Ferritin: 65.8 ng/mL (ref 10.0–291.0)

## 2024-05-24 DIAGNOSIS — K136 Irritative hyperplasia of oral mucosa: Secondary | ICD-10-CM | POA: Diagnosis not present

## 2024-05-24 DIAGNOSIS — J029 Acute pharyngitis, unspecified: Secondary | ICD-10-CM | POA: Diagnosis not present

## 2024-05-24 DIAGNOSIS — Z6822 Body mass index (BMI) 22.0-22.9, adult: Secondary | ICD-10-CM | POA: Diagnosis not present

## 2024-05-24 DIAGNOSIS — B370 Candidal stomatitis: Secondary | ICD-10-CM | POA: Diagnosis not present

## 2024-05-26 DIAGNOSIS — Z8742 Personal history of other diseases of the female genital tract: Secondary | ICD-10-CM | POA: Diagnosis not present

## 2024-05-26 DIAGNOSIS — Z113 Encounter for screening for infections with a predominantly sexual mode of transmission: Secondary | ICD-10-CM | POA: Diagnosis not present

## 2024-05-26 DIAGNOSIS — R87811 Vaginal high risk human papillomavirus (HPV) DNA test positive: Secondary | ICD-10-CM | POA: Diagnosis not present

## 2024-05-26 DIAGNOSIS — R87810 Cervical high risk human papillomavirus (HPV) DNA test positive: Secondary | ICD-10-CM | POA: Diagnosis not present

## 2024-05-26 DIAGNOSIS — Z01419 Encounter for gynecological examination (general) (routine) without abnormal findings: Secondary | ICD-10-CM | POA: Diagnosis not present

## 2024-05-26 DIAGNOSIS — Z130 Encounter for screening for diseases of the blood and blood-forming organs and certain disorders involving the immune mechanism: Secondary | ICD-10-CM | POA: Diagnosis not present

## 2024-06-10 DIAGNOSIS — L648 Other androgenic alopecia: Secondary | ICD-10-CM | POA: Diagnosis not present

## 2024-06-10 DIAGNOSIS — L650 Telogen effluvium: Secondary | ICD-10-CM | POA: Diagnosis not present

## 2024-07-25 DIAGNOSIS — Z8669 Personal history of other diseases of the nervous system and sense organs: Secondary | ICD-10-CM | POA: Diagnosis not present

## 2024-07-25 DIAGNOSIS — R768 Other specified abnormal immunological findings in serum: Secondary | ICD-10-CM | POA: Diagnosis not present

## 2024-07-25 DIAGNOSIS — Z79899 Other long term (current) drug therapy: Secondary | ICD-10-CM | POA: Diagnosis not present

## 2024-07-25 DIAGNOSIS — H209 Unspecified iridocyclitis: Secondary | ICD-10-CM | POA: Diagnosis not present

## 2024-07-25 DIAGNOSIS — Z111 Encounter for screening for respiratory tuberculosis: Secondary | ICD-10-CM | POA: Diagnosis not present

## 2024-07-25 DIAGNOSIS — R5383 Other fatigue: Secondary | ICD-10-CM | POA: Diagnosis not present

## 2024-10-14 DIAGNOSIS — H1045 Other chronic allergic conjunctivitis: Secondary | ICD-10-CM | POA: Diagnosis not present

## 2024-10-14 DIAGNOSIS — H5213 Myopia, bilateral: Secondary | ICD-10-CM | POA: Diagnosis not present

## 2024-10-14 DIAGNOSIS — H31091 Other chorioretinal scars, right eye: Secondary | ICD-10-CM | POA: Diagnosis not present

## 2024-10-27 ENCOUNTER — Encounter: Payer: Self-pay | Admitting: Radiology

## 2024-11-12 DIAGNOSIS — H00024 Hordeolum internum left upper eyelid: Secondary | ICD-10-CM | POA: Diagnosis not present

## 2024-11-12 DIAGNOSIS — H15042 Scleritis with corneal involvement, left eye: Secondary | ICD-10-CM | POA: Diagnosis not present

## 2024-11-26 DIAGNOSIS — H15042 Scleritis with corneal involvement, left eye: Secondary | ICD-10-CM | POA: Diagnosis not present

## 2025-01-07 ENCOUNTER — Ambulatory Visit: Admitting: Family Medicine

## 2025-01-07 VITALS — BP 110/62 | HR 95 | Temp 98.1°F | Resp 16 | Ht 71.0 in | Wt 152.8 lb

## 2025-01-07 DIAGNOSIS — L02416 Cutaneous abscess of left lower limb: Secondary | ICD-10-CM

## 2025-01-07 DIAGNOSIS — L723 Sebaceous cyst: Secondary | ICD-10-CM | POA: Diagnosis not present

## 2025-01-07 MED ORDER — DOXYCYCLINE HYCLATE 100 MG PO TABS: 100.0000 mg | ORAL_TABLET | Freq: Two times a day (BID) | ORAL | 0 refills | Status: DC

## 2025-01-07 NOTE — Patient Instructions (Addendum)
 A few things to remember from today's visit:  Sebaceous cyst  Abscess of left leg - Plan: doxycycline  (VIBRA -TABS) 100 MG tablet Warm compressions and start antibiotics. Your dermatologist may be able to removed cyst once infection has resolved.  If you need refills for medications you take chronically, please call your pharmacy. Do not use My Chart to request refills or for acute issues that need immediate attention. If you send a my chart message, it may take a few days to be addressed, specially if I am not in the office.  Please be sure medication list is accurate. If a new problem present, please set up appointment sooner than planned today.

## 2025-01-07 NOTE — Progress Notes (Signed)
 "  ACUTE VISIT Chief Complaint  Patient presents with   Cyst    Patient reports having a cyst on her left leg x 1 year but its been red, sore and swollen x 3 days   Discussed the use of AI scribe software for clinical note transcription with the patient, who gave verbal consent to proceed. History of Present Illness Megan Pearson is a 39 year old female with a  PMHx significant for GERD, anxiety,and anterior uveitis on Humira who presents with redness and pain in the left leg for the past 3 days as described above.  She has had a non tender cyst  on affected area for about a year, which has been followed by her dermatologist and told it was benign. Stable until recently, over the past three days, the cyst has become red and very painful. There is no history of recent injury to the area, and she has not observed any drainage from the cyst.  She has been managing the symptoms with ibuprofen  and initially used hot compresses, but discontinued them due to discomfort. The cyst is typically the size of a pea when not inflamed.  No fever, chills, changes in appetite,or body aches.   Review of Systems  HENT:  Negative for mouth sores and sore throat.   Respiratory:  Negative for cough and shortness of breath.   Gastrointestinal:  Negative for abdominal pain, nausea and vomiting.  Musculoskeletal:  Negative for arthralgias and joint swelling.  Skin:  Negative for wound.  Neurological:  Negative for syncope, weakness and numbness.  See other pertinent positives and negatives in HPI.  Medications Ordered Prior to Encounter[1]  Past Medical History:  Diagnosis Date   Anxiety    Ectopic pregnancy    Allergies[2]  Social History   Socioeconomic History   Marital status: Married    Spouse name: Not on file   Number of children: Not on file   Years of education: Not on file   Highest education level: Master's degree (e.g., MA, MS, MEng, MEd, MSW, MBA)  Occupational History   Not on file   Tobacco Use   Smoking status: Former    Current packs/day: 0.50    Average packs/day: 0.5 packs/day for 10.0 years (5.0 ttl pk-yrs)    Types: Cigarettes   Smokeless tobacco: Never  Vaping Use   Vaping status: Never Used  Substance and Sexual Activity   Alcohol use: Yes    Comment: rare   Drug use: No   Sexual activity: Yes    Birth control/protection: None  Other Topics Concern   Not on file  Social History Narrative   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     Social Drivers of Health   Tobacco Use: Medium Risk (01/07/2025)   Patient History    Smoking Tobacco Use: Former    Smokeless Tobacco Use: Never    Passive Exposure: Not on file  Financial Resource Strain: Low Risk (01/07/2025)   Overall Financial Resource Strain (CARDIA)    Difficulty of Paying Living Expenses: Not hard at all  Food Insecurity: No Food Insecurity (01/07/2025)   Epic    Worried About Programme Researcher, Broadcasting/film/video in the Last Year: Never true    Ran Out of Food in the Last Year: Never true  Transportation Needs: No Transportation Needs (01/07/2025)   Epic    Lack of Transportation (Medical): No  Lack of Transportation (Non-Medical): No  Physical Activity: Sufficiently Active (01/07/2025)   Exercise Vital Sign    Days of Exercise per Week: 5 days    Minutes of Exercise per Session: 30 min  Stress: No Stress Concern Present (01/07/2025)   [employer] of Occupational Health - Occupational Stress Questionnaire    Feeling of Stress: Only a little  Social Connections: Moderately Isolated (01/07/2025)   Social Connection and Isolation Panel    Frequency of Communication with Friends and Family: Three times a week    Frequency of Social Gatherings with Friends and Family: Once a week    Attends Religious Services: Patient declined    Active Member of Clubs or Organizations: No    Attends Engineer, Structural: Not on file     Marital Status: Married  Depression (PHQ2-9): Low Risk (01/07/2025)   Depression (PHQ2-9)    PHQ-2 Score: 0  Alcohol Screen: Low Risk (01/07/2025)   Alcohol Screen    Last Alcohol Screening Score (AUDIT): 1  Housing: Unknown (01/07/2025)   Epic    Unable to Pay for Housing in the Last Year: No    Number of Times Moved in the Last Year: Not on file    Homeless in the Last Year: No  Utilities: Not on file  Health Literacy: Not on file   Vitals:   01/07/25 1109  BP: 110/62  Pulse: 95  Resp: 16  Temp: 98.1 F (36.7 C)  SpO2: 98%   Body mass index is 21.31 kg/m.  Physical Exam Vitals and nursing note reviewed.  Constitutional:      General: She is not in acute distress.    Appearance: She is well-developed.  HENT:     Head: Normocephalic and atraumatic.  Eyes:     Conjunctiva/sclera: Conjunctivae normal.  Pulmonary:     Effort: Pulmonary effort is normal. No respiratory distress.  Lymphadenopathy:     Cervical: No cervical adenopathy.  Skin:    General: Skin is warm.     Findings: Erythema and lesion present. No rash.         Comments: See picture.  Neurological:     General: No focal deficit present.     Mental Status: She is alert and oriented to person, place, and time.     Gait: Gait normal.  Psychiatric:        Mood and Affect: Mood and affect normal.     ASSESSMENT AND PLAN:  Ms. Megan Pearson was seen today for cyst.  Diagnoses and all orders for this visit:  Sebaceous cyst Problem has been going on for about a year, previously pea-sized, in left lower extremity. We discussed diagnosis, prognosis, and treatment options. She has an appointment with her dermatologist in 01/2025, when she can discuss treatment options.  Abscess of left leg Not ready for I&D. Recommend continuing local heat compression. Doxycycline  100 mg twice daily for 7 days, we discussed some side effects. Continue OTC ibuprofen  for pain management, 400 mg 3 times daily with  food. Monitor for new symptoms. Instructed about warning signs.  -     doxycycline  (VIBRA -TABS) 100 MG tablet; Take 1 tablet (100 mg total) by mouth 2 (two) times daily for 7 days.  Return if symptoms worsen or fail to improve, for keep next appointment.  Chibuike Fleek G. Trayveon Beckford, MD  [facility]. [place] office.     [1]  Current Outpatient Medications on File Prior to Visit  Medication Sig Dispense Refill   adalimumab (  HUMIRA, 2 PEN,) 40 MG/0.4ML pen Inject 40 mg into the skin every other day. (Patient taking differently: Inject 40 mg into the skin. Every other week)     fluticasone (FLONASE) 50 MCG/ACT nasal spray Place into both nostrils daily.     hydrOXYzine  (VISTARIL ) 25 MG capsule Take 1 capsule (25 mg total) by mouth every 8 (eight) hours as needed for anxiety. 30 capsule 0   ibuprofen  (ADVIL ) 200 MG tablet Take 400 mg by mouth every 6 (six) hours as needed for headache.     meclizine  (ANTIVERT ) 25 MG tablet Take 25 mg by mouth 3 (three) times daily as needed for dizziness.     Multiple Vitamins-Minerals (MULTIVITAMIN WITH MINERALS) tablet Take 1 tablet by mouth daily.     prednisoLONE acetate (PRED FORTE) 1 % ophthalmic suspension Place 1 drop into both eyes daily as needed (eye inflammation).     tirzepatide (ZEPBOUND) 12.5 MG/0.5ML Pen Inject 12.5 mg into the skin once a week. (Patient taking differently: Inject 5 mg into the skin. Every 10 days)     No current facility-administered medications on file prior to visit.  [2] No Known Allergies  "

## 2025-01-10 ENCOUNTER — Ambulatory Visit (HOSPITAL_COMMUNITY)
Admission: RE | Admit: 2025-01-10 | Discharge: 2025-01-10 | Disposition: A | Discharge status: Home / Self Care | Source: Ambulatory Visit | Attending: Nurse Practitioner

## 2025-01-10 ENCOUNTER — Encounter (HOSPITAL_COMMUNITY): Payer: Self-pay

## 2025-01-10 VITALS — BP 121/78 | HR 103 | Temp 98.9°F | Resp 16

## 2025-01-10 DIAGNOSIS — L089 Local infection of the skin and subcutaneous tissue, unspecified: Secondary | ICD-10-CM

## 2025-01-10 DIAGNOSIS — L720 Epidermal cyst: Secondary | ICD-10-CM | POA: Diagnosis not present

## 2025-01-10 MED ORDER — LIDOCAINE HCL (PF) 2 % IJ SOLN
INTRAMUSCULAR | Status: AC
  Filled 2025-01-10: qty 5

## 2025-01-10 MED ORDER — IBUPROFEN 800 MG PO TABS: 800.0000 mg | ORAL_TABLET | Freq: Three times a day (TID) | ORAL | 0 refills | Status: AC | PRN

## 2025-01-10 NOTE — ED Triage Notes (Incomplete)
 Abscess- Inflamed cyst on the left side of calf. Currently on Dox.

## 2025-01-10 NOTE — ED Provider Notes (Signed)
 " [facility]    CSN: [id number] Arrival date & time: 01/10/25  1827      History   Chief Complaint Chief Complaint  Patient presents with   Abscess    I've had an inflamed cyst that rapidly got red and painful over the past week. I went to primary care Wednesday and started antibiotics. It has not improved and pain is worse. Would like someone to evaluate lancing it, I have work travel next week. - Entered by patient    HPI Megan Pearson is a 39 y.o. female.   Discussed the use of AI scribe software for clinical note transcription with the patient, who gave verbal consent to proceed.   Megan Pearson presents with an infected sebaceous cyst on her leg that has been present for approximately one year but recently became symptomatic. The cyst was initially pea-sized and asymptomatic for about a year. Last week, she began experiencing pain in the area, describing rapid worsening progression. The lesion subsequently developed redness and increased pain.  She was seen by her primary care provider on January 14th, 2026, and was started on doxycycline  and instructed to apply local heat to the area. Despite antibiotic treatment, the cyst has continued to worsen with spreading redness, though the rate of spread has slowed somewhat since starting antibiotics. The lesion now appears darker and more bruised looking compared to initial presentation. She has not experienced any systemic symptoms such as fever or chills.   The patient has a scheduled dermatology appointment in a couple of weeks but is concerned about an upcoming work trip from Tuesday through Friday next week. She is also questioning whether she should continue her Humira medication given the current infection, as the medication carries warnings about use during active infections. Her rheumatologist's office was closed when she attempted to contact them for guidance.  The following sections of the patient's history were reviewed  and updated as appropriate: allergies, current medications, past family history, past medical history, past social history, past surgical history, and problem list.     Past Medical History:  Diagnosis Date   Anxiety    Ectopic pregnancy     Patient Active Problem List   Diagnosis Date Noted   H/O bilateral salpingectomy 02/25/2024   Routine general medical examination at a health care facility 10/02/2023   Drug-induced insomnia (HCC) 03/16/2023   Sinus tachycardia 03/16/2023   Elevated blood pressure reading 03/02/2023   Scleritis 01/15/2023   Obesity (BMI 30.0-34.9) 12/14/2020   Melanocytic nevus 03/28/2019   Seborrheic keratoses 03/28/2019   Back pain 06/25/2016   GERD (gastroesophageal reflux disease) 05/16/2016   Episcleritis 02/23/2015    Past Surgical History:  Procedure Laterality Date   IUD REMOVAL Bilateral 02/25/2024   Procedure: INTRAUTERINE DEVICE (IUD) REMOVAL;  Surgeon: Danielle Rom, MD;  Location: [facility] OR;  Service: Gynecology;  Laterality: Bilateral;   KNEE SURGERY Left    arthroscopy   LYMPH NODE BIOPSY     benign, Left side of neck    UPPER GI ENDOSCOPY     XI ROBOTIC ASSISTED SALPINGECTOMY Bilateral 02/25/2024   Procedure: XI ROBOTIC ASSISTED SALPINGECTOMY;  Surgeon: Danielle Rom, MD;  Location: [facility];  Service: Gynecology;  Laterality: Bilateral;  [facility]    OB History     Gravida  4   Para  2   Term  2   Preterm      AB  2   Living  2      SAB  IAB  1   Ectopic  1   Multiple  0   Live Births  2            Home Medications    Prior to Admission medications  Medication Sig Start Date End Date Taking? Authorizing Provider  doxycycline  (VIBRA -TABS) 100 MG tablet Take 1 tablet (100 mg total) by mouth 2 (two) times daily for 7 days. 01/07/25 01/14/25 Yes Jordan, Betty G, MD  fluticasone (FLONASE) 50 MCG/ACT nasal spray Place into both nostrils daily.   Yes [provider]  ibuprofen  (ADVIL ) 800 MG tablet  Take 1 tablet (800 mg total) by mouth every 8 (eight) hours as needed (pain). Take with food to avoid stomach upset. Do not take any additional NSAIDs while on this. You may take tylenol  in addition to this if needed for extra pain relief. 01/10/25  Yes Iola Lukes, FNP  Multiple Vitamins-Minerals (MULTIVITAMIN WITH MINERALS) tablet Take 1 tablet by mouth daily.   Yes [provider]  tirzepatide (ZEPBOUND) 12.5 MG/0.5ML Pen Inject 12.5 mg into the skin once a week. Patient taking differently: Inject 5 mg into the skin. Every 10 days   Yes [provider]  adalimumab (HUMIRA, 2 PEN,) 40 MG/0.4ML pen Inject 40 mg into the skin every other day. Patient taking differently: Inject 40 mg into the skin. Every other week    [provider]  hydrOXYzine  (VISTARIL ) 25 MG capsule Take 1 capsule (25 mg total) by mouth every 8 (eight) hours as needed for anxiety. 04/03/23   Jordan, Betty G, MD  meclizine  (ANTIVERT ) 25 MG tablet Take 25 mg by mouth 3 (three) times daily as needed for dizziness.    [provider]  prednisoLONE acetate (PRED FORTE) 1 % ophthalmic suspension Place 1 drop into both eyes daily as needed (eye inflammation).    [provider]    Family History Family History  Problem Relation Age of Onset   Breast cancer Mother    Hypertension Mother    Cancer Mother    Macular degeneration Mother    GER disease Mother    Colon cancer Father    Cancer Father    Hypertension Brother    Obesity Brother    Hypertension Maternal Grandmother    Cancer Maternal Grandfather    Skin cancer Maternal Grandfather    Alcohol abuse Neg Hx    Arthritis Neg Hx    Asthma Neg Hx    Birth defects Neg Hx    COPD Neg Hx    Depression Neg Hx    Diabetes Neg Hx    Drug abuse Neg Hx    Early death Neg Hx    Hearing loss Neg Hx    Heart disease Neg Hx    Hyperlipidemia Neg Hx    Kidney disease Neg Hx    Learning disabilities Neg Hx    Mental illness Neg  Hx    Mental retardation Neg Hx    Miscarriages / Stillbirths Neg Hx    Stroke Neg Hx    Vision loss Neg Hx    Varicose Veins Neg Hx     Social History Social History[1]   Allergies   Patient has no known allergies.   Review of Systems Review of Systems  Constitutional:  Negative for chills and fever.  Skin:  Positive for wound.  All other systems reviewed and are negative.    Physical Exam Triage Vital Signs ED Triage Vitals  Encounter Vitals Group  BP 01/10/25 1853 121/78     Girls Systolic BP Percentile --      Girls Diastolic BP Percentile --      Boys Systolic BP Percentile --      Boys Diastolic BP Percentile --      Pulse Rate 01/10/25 1853 (!) 103     Resp 01/10/25 1853 16     Temp 01/10/25 1853 98.9 F (37.2 C)     Temp Source 01/10/25 1853 Oral     SpO2 01/10/25 1856 98 %     Weight --      Height --      Head Circumference --      Peak Flow --      Pain Score --      Pain Loc --      Pain Education --      Exclude from Growth Chart --    No data found.  Updated Vital Signs BP 121/78   Pulse (!) 103   Temp 98.9 F (37.2 C) (Oral)   Resp 16   LMP 12/14/2024 (Exact Date)   SpO2 98%   Visual Acuity Right Eye Distance:   Left Eye Distance:   Bilateral Distance:    Right Eye Near:   Left Eye Near:    Bilateral Near:     Physical Exam Vitals reviewed.  Constitutional:      General: She is awake. She is not in acute distress.    Appearance: Normal appearance. She is well-developed. She is not ill-appearing, toxic-appearing or diaphoretic.  HENT:     Head: Normocephalic.     Right Ear: Hearing normal.     Left Ear: Hearing normal.     Nose: Nose normal.     Mouth/Throat:     Mouth: Mucous membranes are moist.  Eyes:     General: Vision grossly intact.     Conjunctiva/sclera: Conjunctivae normal.  Cardiovascular:     Rate and Rhythm: Normal rate and regular rhythm.     Heart sounds: Normal heart sounds.  Pulmonary:     Effort:  Pulmonary effort is normal.     Breath sounds: Normal breath sounds and air entry.  Musculoskeletal:        General: Normal range of motion.     Cervical back: Normal range of motion and neck supple.  Skin:    General: Skin is warm and dry.     Comments: 2 x 3 lesion 5.5 x 7 cm erythema   Neurological:     General: No focal deficit present.     Mental Status: She is alert and oriented to person, place, and time.  Psychiatric:        Speech: Speech normal.        Behavior: Behavior is cooperative.      UC Treatments / Results  Labs (all labs ordered are listed, but only abnormal results are displayed) Labs Reviewed - No data to display  EKG   Radiology No results found.  Procedures Incision and Drainage  Date/Time: 01/10/2025 7:24 PM  Performed by: Iola Lukes, FNP Authorized by: Iola Lukes, FNP   Consent:    Consent obtained:  Verbal   Consent given by:  Patient   Risks, benefits, and alternatives were discussed: yes     Risks discussed:  Bleeding, incomplete drainage and pain   Alternatives discussed:  Delayed treatment and observation Universal protocol:    Procedure explained and questions answered to patient or proxy's satisfaction: yes  Patient identity confirmed:  Verbally with patient and arm band Location:    Type:  Cyst (epidermoid cyst)   Location:  Lower extremity   Lower extremity location:  Leg   Leg location:  L lower leg Pre-procedure details:    Skin preparation:  Chlorhexidine  with alcohol and povidone-iodine  Sedation:    Sedation type:  None Anesthesia:    Anesthesia method:  Local infiltration   Local anesthetic:  Lidocaine  2% w/o epi Procedure type:    Complexity:  Complex Procedure details:    Incision types:  Single straight   Wound management:  Irrigated with saline   Drainage:  Purulent   Drainage amount:  Moderate   Packing materials:  1/2 in iodoform gauze Post-procedure details:    Procedure completion:   Tolerated well, no immediate complications  (including critical care time)  Medications Ordered in UC Medications - No data to display  Initial Impression / Assessment and Plan / UC Course  I have reviewed the triage vital signs and the nursing notes.  Pertinent labs & imaging results that were available during my care of the patient were reviewed by me and considered in my medical decision making (see chart for details).     Patient presents with an infected sebaceous (epidermoid) cyst of the leg that has been present for approximately one year and recently became acutely inflamed. The lesion rapidly progressed from asymptomatic to painful with increased erythema and signs of active infection. The patient was started on doxycycline  and warm compresses by their primary care provider on January 14th. Since initiation of treatment, the spread of redness has slowed; however, the lesion has become darker in appearance with bruising, indicating progression to a localized abscess. The patient denies fever, chills, or other systemic symptoms.  Given the presence of active infection and fluctuance, definitive management required incision and drainage rather than formal excision at this time. An incision and drainage procedure was performed in clinic with evacuation of purulent material, followed by irrigation of the wound cavity and placement of packing. The patient was advised to continue the current course of doxycycline  and warm compresses to promote continued drainage and resolution of infection.  The patient was instructed to return in two days for packing removal and reassessment. Follow-up with primary care or return to clinic is advised if symptoms do not improve or worsen. The patient was counseled to seek emergency care for development of fever, chills, rapidly spreading redness, worsening pain, red streaking, increasing swelling, or any other signs of systemic infection.  Today's evaluation has  revealed no signs of a dangerous process. Discussed diagnosis with patient and/or guardian. Patient and/or guardian aware of their diagnosis, possible red flag symptoms to watch out for and need for close follow up. Patient and/or guardian understands verbal and written discharge instructions. Patient and/or guardian comfortable with plan and disposition.  Patient and/or guardian has a clear mental status at this time, good insight into illness (after discussion and teaching) and has clear judgment to make decisions regarding their care  Documentation was completed with the aid of voice recognition software. Transcription may contain typographical errors.  Final Clinical Impressions(s) / UC Diagnoses   Final diagnoses:  Infected epidermoid cyst     Discharge Instructions      You were seen today for an infected epidermoid cyst on your left leg. Because the infection is active, the area needed to be opened and drained rather than removed completely. The cyst was drained in the clinic, the area  was cleaned, and packing was placed to allow continued drainage and healing.  Continue taking the doxycycline  exactly as prescribed until the full course is completed. Apply warm compresses to the area several times a day, as this helps improve blood flow and drainage. Keep the area clean and dry, and leave the packing and dressing in place unless you are instructed otherwise. Some drainage, mild soreness, and redness around the area are expected over the next couple of days. You may take the prescribed ibuprofen  as needed for pain. While taking this medication, do not use over-the-counter anti-inflammatories such as aspirin, naprosyn or Aleve, as this may increase the risk of side effects. If needed, you may take Tylenol  (acetaminophen ) 1000 mg every six hours for additional pain relief. This equals two 500 mg tablets at a time. Be careful not to take more than 4000 mg of Tylenol  in a 24-hour period.  You  should return to the clinic in two days for packing removal and to reassess how the area is healing. Seek emergency care right away if you develop fever or chills, rapidly spreading redness, worsening or severe pain, red streaks extending from the area, increasing swelling, foul-smelling drainage, or if you feel generally unwell, as these may be signs of a more serious infection.  Keep your appointment with dermatology next month as formal excision may be needed once the infection has completely cleared.      ED Prescriptions     Medication Sig Dispense Auth. Provider   ibuprofen  (ADVIL ) 800 MG tablet Take 1 tablet (800 mg total) by mouth every 8 (eight) hours as needed (pain). Take with food to avoid stomach upset. Do not take any additional NSAIDs while on this. You may take tylenol  in addition to this if needed for extra pain relief. 21 tablet Iola Lukes, FNP      PDMP not reviewed this encounter.      [1]  Social History Tobacco Use   Smoking status: Former    Current packs/day: 0.50    Average packs/day: 0.5 packs/day for 10.0 years (5.0 ttl pk-yrs)    Types: Cigarettes   Smokeless tobacco: Never  Vaping Use   Vaping status: Never Used  Substance Use Topics   Alcohol use: Yes    Comment: rare   Drug use: No   "

## 2025-01-10 NOTE — Discharge Instructions (Addendum)
 You were seen today for an infected epidermoid cyst on your left leg. Because the infection is active, the area needed to be opened and drained rather than removed completely. The cyst was drained in the clinic, the area was cleaned, and packing was placed to allow continued drainage and healing.  Continue taking the doxycycline  exactly as prescribed until the full course is completed. Apply warm compresses to the area several times a day, as this helps improve blood flow and drainage. Keep the area clean and dry, and leave the packing and dressing in place unless you are instructed otherwise. Some drainage, mild soreness, and redness around the area are expected over the next couple of days. You may take the prescribed ibuprofen  as needed for pain. While taking this medication, do not use over-the-counter anti-inflammatories such as aspirin, naprosyn or Aleve, as this may increase the risk of side effects. If needed, you may take Tylenol  (acetaminophen ) 1000 mg every six hours for additional pain relief. This equals two 500 mg tablets at a time. Be careful not to take more than 4000 mg of Tylenol  in a 24-hour period.  You should return to the clinic in two days for packing removal and to reassess how the area is healing. Seek emergency care right away if you develop fever or chills, rapidly spreading redness, worsening or severe pain, red streaks extending from the area, increasing swelling, foul-smelling drainage, or if you feel generally unwell, as these may be signs of a more serious infection.  Keep your appointment with dermatology next month as formal excision may be needed once the infection has completely cleared.

## 2025-01-11 ENCOUNTER — Telehealth (HOSPITAL_COMMUNITY): Payer: Self-pay | Admitting: Nurse Practitioner

## 2025-01-11 NOTE — Telephone Encounter (Signed)
 Visit noted was routed to patient's PCP for continuity of care

## 2025-01-12 ENCOUNTER — Encounter (HOSPITAL_COMMUNITY): Payer: Self-pay

## 2025-01-12 ENCOUNTER — Ambulatory Visit (HOSPITAL_COMMUNITY)
Admission: RE | Admit: 2025-01-12 | Discharge: 2025-01-12 | Disposition: A | Discharge status: Home / Self Care | Attending: Nurse Practitioner | Admitting: Nurse Practitioner

## 2025-01-12 VITALS — BP 107/68 | HR 74 | Temp 98.2°F | Resp 16

## 2025-01-12 DIAGNOSIS — Z5189 Encounter for other specified aftercare: Secondary | ICD-10-CM

## 2025-01-12 DIAGNOSIS — L089 Local infection of the skin and subcutaneous tissue, unspecified: Secondary | ICD-10-CM | POA: Diagnosis not present

## 2025-01-12 DIAGNOSIS — L720 Epidermal cyst: Secondary | ICD-10-CM

## 2025-01-12 NOTE — ED Provider Notes (Signed)
 " [facility]    CSN: [id number] Arrival date & time: 01/12/25  1353      History   Chief Complaint Chief Complaint  Patient presents with   Follow-up    Entered by patient    HPI Megan Pearson is a 39 y.o. female.   Discussed the use of AI scribe software for clinical note transcription with the patient, who gave verbal consent to proceed.   Megan Pearson returns for follow-up of an infected sebaceous cyst on her leg that was treated with incision and drainage here 2 days ago. She reports significant improvement since the procedure, stating she felt so much better immediately after walking out on Saturday and continues to do well. There has been some pain that has been relieved with ibuprofen . The patient notes the area no longer feels mad or swollen like it was before the procedure, and describes feeling much better on the inside. She is completing her course of oral antibiotics, which will be finished in 2 days. The patient is scheduled to travel tomorrow and expressed relief that the infection improved before her work trip.   The following sections of the patient's history were reviewed and updated as appropriate: allergies, current medications, past family history, past medical history, past social history, past surgical history, and problem list.         Image of affected area of left leg from PCP office on 01/07/25     Image of the affected area of left leg from urgent care visit on 01/10/25  Past Medical History:  Diagnosis Date   Anxiety    Ectopic pregnancy     Patient Active Problem List   Diagnosis Date Noted   H/O bilateral salpingectomy 02/25/2024   Routine general medical examination at a health care facility 10/02/2023   Drug-induced insomnia (HCC) 03/16/2023   Sinus tachycardia 03/16/2023   Elevated blood pressure reading 03/02/2023   Scleritis 01/15/2023   Obesity (BMI 30.0-34.9) 12/14/2020   Melanocytic nevus 03/28/2019   Seborrheic  keratoses 03/28/2019   Back pain 06/25/2016   GERD (gastroesophageal reflux disease) 05/16/2016   Episcleritis 02/23/2015    Past Surgical History:  Procedure Laterality Date   IUD REMOVAL Bilateral 02/25/2024   Procedure: INTRAUTERINE DEVICE (IUD) REMOVAL;  Surgeon: Danielle Rom, MD;  Location: [facility];  Service: Gynecology;  Laterality: Bilateral;   KNEE SURGERY Left    arthroscopy   LYMPH NODE BIOPSY     benign, Left side of neck    UPPER GI ENDOSCOPY     XI ROBOTIC ASSISTED SALPINGECTOMY Bilateral 02/25/2024   Procedure: XI ROBOTIC ASSISTED SALPINGECTOMY;  Surgeon: Danielle Rom, MD;  Location: [facility];  Service: Gynecology;  Laterality: Bilateral;  [facility]    OB History     Gravida  4   Para  2   Term  2   Preterm      AB  2   Living  2      SAB      IAB  1   Ectopic  1   Multiple  0   Live Births  2            Home Medications    Prior to Admission medications  Medication Sig Start Date End Date Taking? Authorizing Provider  adalimumab (HUMIRA, 2 PEN,) 40 MG/0.4ML pen Inject 40 mg into the skin every other day. Patient taking differently: Inject 40 mg into the skin. Every other week    [provider]  doxycycline  (  VIBRA -TABS) 100 MG tablet Take 1 tablet (100 mg total) by mouth 2 (two) times daily for 7 days. 01/07/25 01/14/25  Jordan, Betty G, MD  fluticasone (FLONASE) 50 MCG/ACT nasal spray Place into both nostrils daily.    [provider]  hydrOXYzine  (VISTARIL ) 25 MG capsule Take 1 capsule (25 mg total) by mouth every 8 (eight) hours as needed for anxiety. 04/03/23   Jordan, Betty G, MD  ibuprofen  (ADVIL ) 800 MG tablet Take 1 tablet (800 mg total) by mouth every 8 (eight) hours as needed (pain). Take with food to avoid stomach upset. Do not take any additional NSAIDs while on this. You may take tylenol  in addition to this if needed for extra pain relief. 01/10/25   Brooklyn Jeff, FNP  meclizine  (ANTIVERT ) 25 MG tablet Take  25 mg by mouth 3 (three) times daily as needed for dizziness.    [provider]  Multiple Vitamins-Minerals (MULTIVITAMIN WITH MINERALS) tablet Take 1 tablet by mouth daily.    [provider]  prednisoLONE acetate (PRED FORTE) 1 % ophthalmic suspension Place 1 drop into both eyes daily as needed (eye inflammation).    [provider]  tirzepatide (ZEPBOUND) 12.5 MG/0.5ML Pen Inject 12.5 mg into the skin once a week. Patient taking differently: Inject 5 mg into the skin. Every 10 days    [provider]    Family History Family History  Problem Relation Age of Onset   Breast cancer Mother    Hypertension Mother    Cancer Mother    Macular degeneration Mother    GER disease Mother    Colon cancer Father    Cancer Father    Hypertension Brother    Obesity Brother    Hypertension Maternal Grandmother    Cancer Maternal Grandfather    Skin cancer Maternal Grandfather    Alcohol abuse Neg Hx    Arthritis Neg Hx    Asthma Neg Hx    Birth defects Neg Hx    COPD Neg Hx    Depression Neg Hx    Diabetes Neg Hx    Drug abuse Neg Hx    Early death Neg Hx    Hearing loss Neg Hx    Heart disease Neg Hx    Hyperlipidemia Neg Hx    Kidney disease Neg Hx    Learning disabilities Neg Hx    Mental illness Neg Hx    Mental retardation Neg Hx    Miscarriages / Stillbirths Neg Hx    Stroke Neg Hx    Vision loss Neg Hx    Varicose Veins Neg Hx     Social History Social History[1]   Allergies   Patient has no known allergies.   Review of Systems Review of Systems  Constitutional:  Negative for chills and fever.  Musculoskeletal:  Negative for myalgias.  Skin:  Positive for wound.  All other systems reviewed and are negative.    Physical Exam Triage Vital Signs ED Triage Vitals  Encounter Vitals Group     BP 01/12/25 1428 107/68     Girls Systolic BP Percentile --      Girls Diastolic BP Percentile --      Boys Systolic BP Percentile --       Boys Diastolic BP Percentile --      Pulse Rate 01/12/25 1428 74     Resp 01/12/25 1430 16     Temp 01/12/25 1428 98.2 F (36.8 C)     Temp Source  01/12/25 1428 Oral     SpO2 01/12/25 1428 99 %     Weight --      Height --      Head Circumference --      Peak Flow --      Pain Score --      Pain Loc --      Pain Education --      Exclude from Growth Chart --    No data found.  Updated Vital Signs BP 107/68 (BP Location: Left Arm)   Pulse 74   Temp 98.2 F (36.8 C) (Oral)   Resp 16   LMP 12/14/2024 (Exact Date)   SpO2 99%   Visual Acuity Right Eye Distance:   Left Eye Distance:   Bilateral Distance:    Right Eye Near:   Left Eye Near:    Bilateral Near:     Physical Exam Vitals reviewed.  Constitutional:      General: She is awake. She is not in acute distress.    Appearance: Normal appearance. She is well-developed. She is not ill-appearing, toxic-appearing or diaphoretic.  HENT:     Head: Normocephalic.     Right Ear: Hearing normal.     Left Ear: Hearing normal.     Nose: Nose normal.     Mouth/Throat:     Mouth: Mucous membranes are moist.  Eyes:     General: Vision grossly intact.     Conjunctiva/sclera: Conjunctivae normal.  Cardiovascular:     Rate and Rhythm: Normal rate and regular rhythm.     Heart sounds: Normal heart sounds.  Pulmonary:     Effort: Pulmonary effort is normal.     Breath sounds: Normal breath sounds and air entry.  Musculoskeletal:        General: Normal range of motion.     Cervical back: Normal range of motion and neck supple.  Skin:    General: Skin is warm and dry.     Findings: Wound present.     Comments: See below   Neurological:     General: No focal deficit present.     Mental Status: She is alert and oriented to person, place, and time.  Psychiatric:        Speech: Speech normal.        Behavior: Behavior is cooperative.     UC Treatments / Results  Labs (all labs ordered are listed, but only abnormal  results are displayed) Labs Reviewed - No data to display  EKG   Radiology No results found.  Procedures Wound Care  Date/Time: 01/12/2025 5:37 PM  Performed by: Iola Lukes, FNP Authorized by: Iola Lukes, FNP   Consent:    Consent obtained:  Verbal   Consent given by:  Patient Sedation:    Sedation type:  None Anesthesia:    Anesthesia method:  None Procedure details:    Wound location:  Leg   Leg location:  L upper leg Dressing:    Dressing applied:  2x2 and Telfa pad   Wrapped with:  Coban 4 inch Post-procedure details:    Procedure completion:  Tolerated well, no immediate complications Comments:     Packing removed. No bleeding or purulent drainage noted from site. Gauze dressing applied over site, followed by a nonadherent dressing then wrapped with coban.     The wound shows improved appearance with decreased erythema. There is no drainage or bleeding, and no underlying fluctuance is appreciated. No swelling or increased warmth is noted.  Medications Ordered in UC Medications - No data to display  Initial Impression / Assessment and Plan / UC Course  I have reviewed the triage vital signs and the nursing notes.  Pertinent labs & imaging results that were available during my care of the patient were reviewed by me and considered in my medical decision making (see chart for details).     Patient returns for follow-up after incision and drainage of an infected sebaceous cyst performed on Saturday. The patient reports significant improvement in pain and mobility since the procedure. On examination, there is notable reduction in swelling and erythema compared to the pre-procedure state. The wound is healing appropriately from the inside out with no signs of worsening infection, abscess reaccumulation, or surrounding cellulitis.  Patient was advised to continue the current oral antibiotic regimen until completion, with the final dose scheduled for  Wednesday morning. Wound care instructions were reviewed, including keeping the area covered with a bandage, allowing water  to run over the wound during showering, gently patting dry, and avoiding scrubbing. The patient was instructed to continue monitoring for signs of infection such as increasing redness, pain, swelling, drainage, or fever. Follow-up with dermatology next month was reinforced for definitive management. The patient was advised to follow up with primary care or return to clinic for any concerns and to seek emergency care for rapidly worsening symptoms or systemic signs of infection.  Today's evaluation has revealed no signs of a dangerous process. Discussed diagnosis with patient and/or guardian. Patient and/or guardian aware of their diagnosis, possible red flag symptoms to watch out for and need for close follow up. Patient and/or guardian understands verbal and written discharge instructions. Patient and/or guardian comfortable with plan and disposition.  Patient and/or guardian has a clear mental status at this time, good insight into illness (after discussion and teaching) and has clear judgment to make decisions regarding their care  Documentation was completed with the aid of voice recognition software. Transcription may contain typographical errors.   Final Clinical Impressions(s) / UC Diagnoses   Final diagnoses:  Encounter for wound re-check  Infected epidermoid cyst     Discharge Instructions      You were seen today for follow-up after the incision and drainage of an infected epidermoid cyst. Your symptoms have improved, and the area is healing well with less redness and swelling and there are no signs of worsening infection.  Continue taking your antibiotic exactly as prescribed until it is finished on Wednesday. Keep the area covered with a clean bandage. You may shower and allow water  to run gently over the wound, then pat it dry. Do not scrub the area or apply  harsh soaps. Mild soreness is normal, and you may continue to take the prescribed ibuprofen  if needed.  Continue to watch the area closely. Follow up with your dermatologist as scheduled next month. Contact your primary care provider or return to the urgent care if you notice increasing redness, swelling, pain, warmth, drainage, or if healing seems to stall. Go to the emergency department right away if you develop fever, chills, rapidly spreading redness, severe pain, red streaking from the wound, or feel generally unwell.     ED Prescriptions   None    PDMP not reviewed this encounter.      [1]  Social History Tobacco Use   Smoking status: Former    Current packs/day: 0.50    Average packs/day: 0.5 packs/day for 10.0 years (5.0 ttl pk-yrs)    Types: Cigarettes  Smokeless tobacco: Never  Vaping Use   Vaping status: Never Used  Substance Use Topics   Alcohol use: Yes    Comment: rare   Drug use: No     Iola Lukes, FNP 01/12/25 1742  "

## 2025-01-12 NOTE — Discharge Instructions (Addendum)
 You were seen today for follow-up after the incision and drainage of an infected epidermoid cyst. Your symptoms have improved, and the area is healing well with less redness and swelling and there are no signs of worsening infection.  Continue taking your antibiotic exactly as prescribed until it is finished on Wednesday. Keep the area covered with a clean bandage. You may shower and allow water  to run gently over the wound, then pat it dry. Do not scrub the area or apply harsh soaps. Mild soreness is normal, and you may continue to take the prescribed ibuprofen  if needed.  Continue to watch the area closely. Follow up with your dermatologist as scheduled next month. Contact your primary care provider or return to the urgent care if you notice increasing redness, swelling, pain, warmth, drainage, or if healing seems to stall. Go to the emergency department right away if you develop fever, chills, rapidly spreading redness, severe pain, red streaking from the wound, or feel generally unwell.

## 2025-01-12 NOTE — ED Triage Notes (Addendum)
 Pt is here for follow up for I&D to her left leg. Pt denies pain.

## 2025-01-13 ENCOUNTER — Encounter: Payer: Self-pay | Admitting: Family Medicine

## 2025-01-13 ENCOUNTER — Ambulatory Visit: Admitting: Family Medicine

## 2025-01-13 ENCOUNTER — Ambulatory Visit: Payer: Self-pay

## 2025-01-13 DIAGNOSIS — L02416 Cutaneous abscess of left lower limb: Secondary | ICD-10-CM

## 2025-01-13 MED ORDER — DOXYCYCLINE HYCLATE 100 MG PO TABS: 100.0000 mg | ORAL_TABLET | Freq: Two times a day (BID) | ORAL | 0 refills | Status: AC

## 2025-01-13 NOTE — Patient Instructions (Signed)
 Clean wound daily with soap and water  and let air dry  Would keep covered with xeroform and telfa for at least a few days until further healed.    Follow up for any recurrent redness, swelling, or warmth.

## 2025-01-13 NOTE — Progress Notes (Signed)
 "  Established Patient Office Visit  Subjective   Patient ID: Megan Pearson, female    DOB: October 13, 1986  Age: 39 y.o. MRN: [id number]  Chief Complaint  Patient presents with   Wound Check    HPI   Megan Pearson is seen for wound recheck left medial/posterior inner calf.  She states she had a cyst there and a week ago today noticed some inflammation and soreness.  She was seen here Wednesday and placed on doxycycline .  This continued to increase in redness and swelling and was seen in urgent care Saturday and incision and drainage was performed.  She had packing placed which was taken out yesterday.  Overall feels better.  Less erythema.  Less swelling.  Still has fairly large open area where she had packing in place.  She is still taking doxycycline  but about to finish up.  Her concern is that she is traveling with work in the next few days and takes Humira.  Past Medical History:  Diagnosis Date   Anxiety    Ectopic pregnancy    Past Surgical History:  Procedure Laterality Date   IUD REMOVAL Bilateral 02/25/2024   Procedure: INTRAUTERINE DEVICE (IUD) REMOVAL;  Surgeon: Danielle Rom, MD;  Location: [facility];  Service: Gynecology;  Laterality: Bilateral;   KNEE SURGERY Left    arthroscopy   LYMPH NODE BIOPSY     benign, Left side of neck    UPPER GI ENDOSCOPY     XI ROBOTIC ASSISTED SALPINGECTOMY Bilateral 02/25/2024   Procedure: XI ROBOTIC ASSISTED SALPINGECTOMY;  Surgeon: Danielle Rom, MD;  Location: [facility];  Service: Gynecology;  Laterality: Bilateral;  [facility]    reports that she has quit smoking. Her smoking use included cigarettes. She has a 5 pack-year smoking history. She has never used smokeless tobacco. She reports current alcohol use. She reports that she does not use drugs. family history includes Breast cancer in her mother; Cancer in her father, maternal grandfather, and mother; Colon cancer in her father; GER disease in her mother; Hypertension in her brother, maternal  grandmother, and mother; Macular degeneration in her mother; Obesity in her brother; Skin cancer in her maternal grandfather. Allergies[1]  Review of Systems  Constitutional:  Negative for chills and fever.      Objective:     BP 112/70   Pulse (!) 110   Temp 97.8 F (36.6 C) (Oral)   Wt 156 lb 11.2 oz (71.1 kg)   LMP 12/14/2024 (Exact Date)   SpO2 96%   BMI 21.86 kg/m  BP Readings from Last 3 Encounters:  01/13/25 112/70  01/12/25 107/68  01/10/25 121/78   Wt Readings from Last 3 Encounters:  01/13/25 156 lb 11.2 oz (71.1 kg)  01/07/25 152 lb 12.8 oz (69.3 kg)  03/31/24 165 lb 2 oz (74.9 kg)      Physical Exam Vitals reviewed.  Constitutional:      General: She is not in acute distress.    Appearance: She is not ill-appearing.  Skin:    Comments: Left proximal inner calf reveals open wound approximately 1 x 2 cm.  This is site of recent I&D.  Resolving cellulitis changes.  No warmth.  No foul odor.  She has little bit of serous drainage.  Has a little bit of mild surrounding induration but no fluctuance.  Neurological:     Mental Status: She is alert.      No results found for any visits on 01/13/25.    The ASCVD Risk score (Arnett  DK, et al., 2019) failed to calculate for the following reasons:   The 2019 ASCVD risk score is only valid for ages 65 to 20    Assessment & Plan:   Problem List Items Addressed This Visit   None Visit Diagnoses       Abscess of left leg       Relevant Medications   doxycycline  (VIBRA -TABS) 100 MG tablet     Recent abscess left leg.  Improving following I&D.  One concern she has is upcoming travel with increased drainage from the wound and also she is on Humira and finishing up antibiotic today.  We agreed to extend her antibiotic another few days.  Wound cleaned today and Xeroform dressing was applied with light Coban wrap and instructions given for wound care.  Clean daily with soap and water  and let air dry then apply  Xeroform dressing with Telfa and light Coban wrap for another few days until this is further healed  No follow-ups on file.    Wolm Scarlet, MD     [1] No Known Allergies  "

## 2025-01-13 NOTE — Telephone Encounter (Signed)
 FYI Only or Action Required?: FYI only for provider: appointment scheduled on 01/13/25.  Patient was last seen in primary care on 01/07/2025 by Jordan, Betty G, MD.  Called Nurse Triage reporting Wound Check.  Symptoms began a week ago.  Interventions attempted: Nothing.  Symptoms are: unchanged.  Triage Disposition: See Physician Within 24 Hours  Patient/caregiver understands and will follow disposition?: Yes   Message from [place] S sent at 01/13/2025 11:40 AM EST  Reason for Triage: Infected cyst and has questions wound care. Wound is still draining. Eva keeps getting stuck and hurt    Reason for Disposition  [1] Taking antibiotic > 72 hours (3 days) AND [2] infected wound not improved (i.e., pain, pus, redness)  Answer Assessment - Initial Assessment Questions Scheduled 01/13/25 Advised call back or ED/911 if symptoms worsen. Patient verbalized understanding.   I&D, at UC, left leg cyst infection;Saturday  Taking abt, on Wednesday, one more day, haven't missed any doses  UC I&D, packed on Saturday, yesterday packing removed, keep covered and use gauze  UC informed to Keep covered and keep clean, no instructions or wound care given, per patient  11. LOCATION: Where is the wound located?      Left side lower leg;No redness, swelling hot to touch Bloody clear drainage, no odor 2. WOUND APPEARANCE: What does the wound look like?      Bruised around it, blisters from medical tape 3. SIZE: If redness is present, ask: What is the size of the red area? (Inches, centimeters, or compare to size of a coin)      Dime size 4. SPREAD: What's changed in the last day?  Do you see any red streaks coming from the wound?     no 5. ONSET: When did it start to look infected?      Last week 6. MECHANISM: How did the wound start, what was the cause?     cyst 7. PAIN: Is there any pain? If Yes, ask: How bad is the pain?   (Scale 1-10; or mild, moderate, severe)      3/10; ibuprofen  8. FEVER: Do you have a fever? If Yes, ask: What is your temperature, how was it measured, and when did it start?     Denies fever chills n/v 9. OTHER SYMPTOMS: Do you have any other symptoms? (e.g., shaking chills, weakness, rash elsewhere on body) Diff breathing, chest pain, rash, fever chills n/v, redness, swelling, hot to touch, drainage  Protocols used: Wound Infection-A-AH
